# Patient Record
Sex: Male | Born: 1995 | Race: Black or African American | Hispanic: No | Marital: Married | Smoking: Never smoker
Health system: Southern US, Community
[De-identification: ages and names within clinical notes are randomized; demographics above are authoritative.]

## PROBLEM LIST (undated history)

## (undated) HISTORY — PX: HERNIA REPAIR: SHX51

---

## 2013-05-03 ENCOUNTER — Emergency Department (HOSPITAL_COMMUNITY)
Admission: EM | Admit: 2013-05-03 | Discharge: 2013-05-04 | Disposition: A | Discharge status: Home / Self Care | Payer: Medicaid Other | Attending: Emergency Medicine | Admitting: Emergency Medicine

## 2013-05-03 ENCOUNTER — Encounter (HOSPITAL_COMMUNITY): Payer: Self-pay | Admitting: Emergency Medicine

## 2013-05-03 DIAGNOSIS — R000 Tachycardia, unspecified: Secondary | ICD-10-CM | POA: Insufficient documentation

## 2013-05-03 DIAGNOSIS — B349 Viral infection, unspecified: Secondary | ICD-10-CM

## 2013-05-03 DIAGNOSIS — R509 Fever, unspecified: Secondary | ICD-10-CM | POA: Insufficient documentation

## 2013-05-03 DIAGNOSIS — R52 Pain, unspecified: Secondary | ICD-10-CM | POA: Insufficient documentation

## 2013-05-03 DIAGNOSIS — B9789 Other viral agents as the cause of diseases classified elsewhere: Secondary | ICD-10-CM | POA: Insufficient documentation

## 2013-05-03 MED ORDER — IBUPROFEN 800 MG PO TABS
800.0000 mg | ORAL_TABLET | Freq: Once | ORAL | Status: AC
  Administered 2013-05-03: 800 mg via ORAL
  Filled 2013-05-03: qty 1

## 2013-05-03 MED ORDER — SODIUM CHLORIDE 0.9 % IV BOLUS (SEPSIS)
1000.0000 mL | Freq: Once | INTRAVENOUS | Status: AC
  Administered 2013-05-04: 1000 mL via INTRAVENOUS

## 2013-05-03 MED ORDER — ACETAMINOPHEN 500 MG PO TABS: 1000.0000 mg | ORAL_TABLET | Freq: Once | ORAL | Status: DC

## 2013-05-03 NOTE — ED Notes (Signed)
Pt states that he has been having generalized body aches, headache and fever today.

## 2013-05-03 NOTE — ED Provider Notes (Signed)
TIME SEEN: 11:26 PM  CHIEF COMPLAINT: Fever, body aches, headache, sore throat  HPI: Patient is a 17 year old male with no significant past medical history presents the emergency department with fever, diffuse and throbbing headache, body aches, sore throat that started today. Patient reports that he also feels lightheaded.  Denies any nausea, vomiting or diarrhea. No cough, chest pain or shortness of breath. Denies any sick contacts. No recent travel. No rash. No neck pain or neck stiffness. No tick bite.  ROS: See HPI Constitutional:  fever  Eyes: no drainage  ENT: no runny nose   Cardiovascular:  no chest pain  Resp: no SOB  GI: no vomiting GU: no dysuria Integumentary: no rash  Allergy: no hives  Musculoskeletal: no leg swelling  Neurological: no slurred speech ROS otherwise negative  PAST MEDICAL HISTORY/PAST SURGICAL HISTORY:  History reviewed. No pertinent past medical history.  MEDICATIONS:  Prior to Admission medications   Medication Sig Start Date End Date Taking? Authorizing Provider  penicillin v potassium (VEETID) 500 MG tablet Take 500 mg by mouth 4 (four) times daily. For 10 days 04/26/13  Yes Historical Provider, MD    ALLERGIES:  No Known Allergies  SOCIAL HISTORY:  History  Substance Use Topics  . Smoking status: Never Smoker   . Smokeless tobacco: Not on file  . Alcohol Use: No    FAMILY HISTORY: No family history on file.  EXAM: BP 116/67  Pulse 108  Temp(Src) 99.2 F (37.3 C) (Oral)  Resp 18  Wt 141 lb 9.6 oz (64.229 kg)  SpO2 100% CONSTITUTIONAL: Alert and oriented and responds appropriately to questions. Well-appearing; well-nourished; nontoxic HEAD: Normocephalic EYES: Conjunctivae clear, PERRL ENT: normal nose; no rhinorrhea; moist mucous membranes; pharynx is mildly erythematous with mild tonsillar hypertrophy without exudate, no uvular deviation, no voice changes, no trismus, no drooling NECK: Supple, no meningismus, no LAD; negative  Kernig's, negative Brudzinski's CARD: RRR; S1 and S2 appreciated; no murmurs, no clicks, no rubs, no gallops RESP: Normal chest excursion without splinting or tachypnea; breath sounds clear and equal bilaterally; no wheezes, no rhonchi, no rales,  ABD/GI: Normal bowel sounds; non-distended; soft, non-tender, no rebound, no guarding BACK:  The back appears normal and is non-tender to palpation, there is no CVA tenderness EXT: Normal ROM in all joints; non-tender to palpation; no edema; normal capillary refill; no cyanosis    SKIN: Normal color for age and race; warm NEURO: Moves all extremities equally, normal gait, sensation to light touch intact diffusely, cranial nerves II through XII intact PSYCH: The patient's mood and manner are appropriate. Grooming and personal hygiene are appropriate.  MEDICAL DECISION MAKING: Patient with mild fever, tachycardia with relatively benign exam. Suspect viral illness. We'll give antipyretics, IV fluids, obtain basic labs and reassess. Have also send strep test. No concern for any life-threatening illness.  ED PROGRESS: Patient strep test is negative. His labs are unremarkable. Tachycardia has improved with improvement of fever. Patient reports that his headache, body aches are completely resolved after ibuprofen. Given supportive care instructions, return precautions. Patient and family members at bedside verbalize understanding and are comfortable with this plan.     Layla Maw Chetara Kropp, DO 05/04/13 0139

## 2013-05-04 ENCOUNTER — Encounter (HOSPITAL_COMMUNITY): Payer: Self-pay | Admitting: Emergency Medicine

## 2013-05-04 LAB — BASIC METABOLIC PANEL
Chloride: 99 mEq/L (ref 96–112)
GFR calc Af Amer: >90 mL/min (ref >90)
GFR calc non Af Amer: >90 mL/min (ref >90)
Potassium: 3.5 mEq/L (ref 3.5–5.1)
Sodium: 136 mEq/L (ref 135–145)

## 2013-05-04 LAB — CBC WITH DIFFERENTIAL/PLATELET
Basophils Absolute: 0 10*3/uL (ref 0.0–0.1)
Basophils Relative: 0 % (ref 0–1)
Eosinophils Absolute: 0.1 10*3/uL (ref 0.0–0.7)
Eosinophils Relative: 1 % (ref 0–5)
MCH: 27.8 pg (ref 26.0–34.0)
MCHC: 33.4 g/dL (ref 30.0–36.0)
Monocytes Relative: 7 % (ref 3–12)
Neutro Abs: 7.9 10*3/uL — ABNORMAL HIGH (ref 1.7–7.7)
Neutrophils Relative %: 87 % — ABNORMAL HIGH (ref 43–77)
Platelets: 246 10*3/uL (ref 150–400)

## 2013-05-04 LAB — RAPID STREP SCREEN (MED CTR MEBANE ONLY): Streptococcus, Group A Screen (Direct): NEGATIVE

## 2013-05-05 LAB — CULTURE, GROUP A STREP

## 2013-09-06 ENCOUNTER — Encounter (HOSPITAL_COMMUNITY): Payer: Self-pay | Admitting: Emergency Medicine

## 2013-09-06 ENCOUNTER — Emergency Department (INDEPENDENT_AMBULATORY_CARE_PROVIDER_SITE_OTHER)
Admission: EM | Admit: 2013-09-06 | Discharge: 2013-09-06 | Disposition: A | Discharge status: Home / Self Care | Payer: Medicaid Other | Source: Home / Self Care

## 2013-09-06 DIAGNOSIS — R509 Fever, unspecified: Secondary | ICD-10-CM

## 2013-09-06 MED ORDER — OSELTAMIVIR PHOSPHATE 75 MG PO CAPS: 75.0000 mg | ORAL_CAPSULE | Freq: Two times a day (BID) | ORAL | Status: DC

## 2013-09-06 NOTE — ED Notes (Signed)
Pt c/o cold sxs onset 3 days Sxs include: fevers, HA, back pain, sneezing, runny nose Taking OTC cold meds w/no relief Denies v/n Alert w/no signs of acute distress.

## 2013-09-06 NOTE — ED Provider Notes (Signed)
Medical screening examination/treatment/procedure(s) were performed by resident physician or non-physician practitioner and as supervising physician I was immediately available for consultation/collaboration.   Amahd Morino DOUGLAS MD.   Irie Fiorello D Sukari Grist, MD 09/06/13 2008 

## 2013-09-06 NOTE — Discharge Instructions (Signed)
You likely have the flu Please get lots of rest adn start the tamiflu as this will shorten the length of your overall illness Please follow up with your doctor or here if you do not improve or get worse.

## 2013-09-06 NOTE — ED Provider Notes (Signed)
CSN: [id number]     Arrival date & time 09/06/13  1540 History   None    Chief Complaint  Patient presents with  . URI     (Consider location/radiation/quality/duration/timing/severity/associated sxs/prior Treatment) HPI  HA and sneezing for 2 days. Now w/ body aches. Associated w/ fever. Tylenol and nyquil w/o benefit. No sick contacts. Getting worse. Decreased PO. Voiding and stooling nml.    History reviewed. No pertinent past medical history. History reviewed. No pertinent past surgical history. No family history on file. History  Substance Use Topics  . Smoking status: Never Smoker   . Smokeless tobacco: Not on file  . Alcohol Use: No    Review of Systems  Constitutional: Positive for fever, chills, activity change and fatigue.  Respiratory: Negative for wheezing.   Cardiovascular: Negative for chest pain.  Musculoskeletal: Positive for myalgias.      Allergies  Review of patient's allergies indicates no known allergies.  Home Medications   Current Outpatient Rx  Name  Route  Sig  Dispense  Refill  . oseltamivir (TAMIFLU) 75 MG capsule   Oral   Take 1 capsule (75 mg total) by mouth 2 (two) times daily.   10 capsule   0   . penicillin v potassium (VEETID) 500 MG tablet   Oral   Take 500 mg by mouth 4 (four) times daily. For 10 days          BP 119/80  Pulse 97  Temp(Src) 100 F (37.8 C) (Oral)  Resp 18  SpO2 97% Physical Exam  Constitutional: He is oriented to person, place, and time. He appears well-developed and well-nourished. No distress.  HENT:  Head: Normocephalic and atraumatic.  Mouth/Throat: No oropharyngeal exudate.  Eyes: EOM are normal. Pupils are equal, round, and reactive to light.  Neck: Neck supple.  Cardiovascular: Normal rate, regular rhythm, normal heart sounds and intact distal pulses.  Exam reveals no gallop.   No murmur heard. Pulmonary/Chest: Effort normal and breath sounds normal.  Abdominal: Soft. He exhibits no  distension.  Musculoskeletal: Normal range of motion. He exhibits no tenderness.  Neurological: He is oriented to person, place, and time.  Skin: Skin is warm. No rash noted. He is not diaphoretic.  Psychiatric: He has a normal mood and affect. His behavior is normal. Judgment and thought content normal.    ED Course  Procedures (including critical care time) Labs Review Labs Reviewed - No data to display Imaging Review No results found.    MDM   Final diagnoses:  Febrile illness    Likely flu given acute onset HA, Myalgia, fever, anorexia w/o uri symptoms.  - tamiflu - tylenol and ibuprofen prn - precautions given and all questions answered  Shelly Flattenavid Srijan Givan, MD Family Medicine PGY-3 09/06/2013, 6:05 PM      Ozella Rocksavid J Ceylin Dreibelbis, MD 09/06/13 [phone]

## 2014-06-23 ENCOUNTER — Encounter (HOSPITAL_COMMUNITY): Payer: Self-pay | Admitting: Emergency Medicine

## 2014-06-23 ENCOUNTER — Other Ambulatory Visit (HOSPITAL_COMMUNITY)
Admission: RE | Admit: 2014-06-23 | Discharge: 2014-06-23 | Disposition: A | Discharge status: Home / Self Care | Payer: Medicaid Other | Source: Ambulatory Visit | Attending: Emergency Medicine | Admitting: Emergency Medicine

## 2014-06-23 ENCOUNTER — Emergency Department (INDEPENDENT_AMBULATORY_CARE_PROVIDER_SITE_OTHER)
Admission: EM | Admit: 2014-06-23 | Discharge: 2014-06-23 | Disposition: A | Discharge status: Home / Self Care | Payer: Medicaid Other | Source: Home / Self Care | Attending: Emergency Medicine | Admitting: Emergency Medicine

## 2014-06-23 DIAGNOSIS — Z113 Encounter for screening for infections with a predominantly sexual mode of transmission: Secondary | ICD-10-CM | POA: Insufficient documentation

## 2014-06-23 DIAGNOSIS — Z202 Contact with and (suspected) exposure to infections with a predominantly sexual mode of transmission: Secondary | ICD-10-CM

## 2014-06-23 LAB — RPR

## 2014-06-23 LAB — HIV ANTIBODY (ROUTINE TESTING W REFLEX): HIV: NONREACTIVE

## 2014-06-23 MED ORDER — AZITHROMYCIN 250 MG PO TABS
ORAL_TABLET | ORAL | Status: AC
  Filled 2014-06-23: qty 4

## 2014-06-23 MED ORDER — AZITHROMYCIN 250 MG PO TABS
1000.0000 mg | ORAL_TABLET | Freq: Once | ORAL | Status: AC
  Administered 2014-06-23: 1000 mg via ORAL

## 2014-06-23 MED ORDER — CEFTRIAXONE SODIUM 250 MG IJ SOLR
INTRAMUSCULAR | Status: AC
  Filled 2014-06-23: qty 250

## 2014-06-23 MED ORDER — CEFTRIAXONE SODIUM 250 MG IJ SOLR
250.0000 mg | Freq: Once | INTRAMUSCULAR | Status: AC
  Administered 2014-06-23: 250 mg via INTRAMUSCULAR

## 2014-06-23 MED ORDER — LIDOCAINE HCL (PF) 1 % IJ SOLN
INTRAMUSCULAR | Status: AC
  Filled 2014-06-23: qty 5

## 2014-06-23 NOTE — ED Notes (Signed)
Patient understands post injection delay prior to leaving

## 2014-06-23 NOTE — ED Notes (Signed)
No adverse reaction to injection.  

## 2014-06-23 NOTE — Discharge Instructions (Signed)
Sexually Transmitted Disease °A sexually transmitted disease (STD) is a disease or infection that may be passed (transmitted) from person to person, usually during sexual activity. This may happen by way of saliva, semen, blood, vaginal mucus, or urine. Common STDs include:  °· Gonorrhea.   °· Chlamydia.   °· Syphilis.   °· HIV and AIDS.   °· Genital herpes.   °· Hepatitis B and C.   °· Trichomonas.   °· Human papillomavirus (HPV).   °· Pubic lice.   °· Scabies. °· Mites. °· Bacterial vaginosis. °WHAT ARE CAUSES OF STDs? °An STD may be caused by bacteria, a virus, or parasites. STDs are often transmitted during sexual activity if one person is infected. However, they may also be transmitted through nonsexual means. STDs may be transmitted after:  °· Sexual intercourse with an infected person.   °· Sharing sex toys with an infected person.   °· Sharing needles with an infected person or using unclean piercing or tattoo needles. °· Having intimate contact with the genitals, mouth, or rectal areas of an infected person.   °· Exposure to infected fluids during birth. °WHAT ARE THE SIGNS AND SYMPTOMS OF STDs? °Different STDs have different symptoms. Some people may not have any symptoms. If symptoms are present, they may include:  °· Painful or bloody urination.   °· Pain in the pelvis, abdomen, vagina, anus, throat, or eyes.   °· A skin rash, itching, or irritation. °· Growths, ulcerations, blisters, or sores in the genital and anal areas. °· Abnormal vaginal discharge with or without bad odor.   °· Penile discharge in men.   °· Fever.   °· Pain or bleeding during sexual intercourse.   °· Swollen glands in the groin area.   °· Yellow skin and eyes (jaundice). This is seen with hepatitis.   °· Swollen testicles. °· Infertility. °· Sores and blisters in the mouth. °HOW ARE STDs DIAGNOSED? °To make a diagnosis, your health care provider may:  °· Take a medical history.   °· Perform a physical exam.   °· Take a sample of  any discharge to examine. °· Swab the throat, cervix, opening to the penis, rectum, or vagina for testing. °· Test a sample of your first morning urine.   °· Perform blood tests.   °· Perform a Pap test, if this applies.   °· Perform a colposcopy.   °· Perform a laparoscopy.   °HOW ARE STDs TREATED? ° Treatment depends on the STD. Some STDs may be treated but not cured.  °· Chlamydia, gonorrhea, trichomonas, and syphilis can be cured with antibiotic medicine.   °· Genital herpes, hepatitis, and HIV can be treated, but not cured, with prescribed medicines. The medicines lessen symptoms.   °· Genital warts from HPV can be treated with medicine or by freezing, burning (electrocautery), or surgery. Warts may come back.   °· HPV cannot be cured with medicine or surgery. However, abnormal areas may be removed from the cervix, vagina, or vulva.   °· If your diagnosis is confirmed, your recent sexual partners need treatment. This is true even if they are symptom-free or have a negative culture or evaluation. They should not have sex until their health care providers say it is okay. °HOW CAN I REDUCE MY RISK OF GETTING AN STD? °Take these steps to reduce your risk of getting an STD: °· Use latex condoms, dental dams, and water-soluble lubricants during sexual activity. Do not use petroleum jelly or oils. °· Avoid having multiple sex partners. °· Do not have sex with someone who has other sex partners. °· Do not have sex with anyone you do not know or who is at   high risk for an STD. °· Avoid risky sex practices that can break your skin. °· Do not have sex if you have open sores on your mouth or skin. °· Avoid drinking too much alcohol or taking illegal drugs. Alcohol and drugs can affect your judgment and put you in a vulnerable position. °· Avoid engaging in oral and anal sex acts. °· Get vaccinated for HPV and hepatitis. If you have not received these vaccines in the past, talk to your health care provider about whether one  or both might be right for you.   °· If you are at risk of being infected with HIV, it is recommended that you take a prescription medicine daily to prevent HIV infection. This is called pre-exposure prophylaxis (PrEP). You are considered at risk if: °¨ You are a man who has sex with other men (MSM). °¨ You are a heterosexual man or woman and are sexually active with more than one partner. °¨ You take drugs by injection. °¨ You are sexually active with a partner who has HIV. °· Talk with your health care provider about whether you are at high risk of being infected with HIV. If you choose to begin PrEP, you should first be tested for HIV. You should then be tested every 3 months for as long as you are taking PrEP.   °WHAT SHOULD I DO IF I THINK I HAVE AN STD? °· See your health care provider.   °· Tell your sexual partner(s). They should be tested and treated for any STDs. °· Do not have sex until your health care provider says it is okay.  °WHEN SHOULD I GET IMMEDIATE MEDICAL CARE? °Contact your health care provider right away if:  °· You have severe abdominal pain. °· You are a man and notice swelling or pain in your testicles. °· You are a woman and notice swelling or pain in your vagina. °Document Released: 09/26/2002 Document Revised: 07/11/2013 Document Reviewed: 01/24/2013 °ExitCare® Patient Information ©2015 [employer]. This information is not intended to replace advice given to you by your health care provider. Make sure you discuss any questions you have with your health care provider. ° °Safe Sex °Safe sex is about reducing the risk of giving or getting a sexually transmitted disease (STD). STDs are spread through sexual contact involving the genitals, mouth, or rectum. Some STDs can be cured and others cannot. Safe sex can also prevent unintended pregnancies.  °WHAT ARE SOME SAFE SEX PRACTICES? °· Limit your sexual activity to only one partner who is having sex with only you. °· Talk to your partner  about his or her past partners, past STDs, and drug use. °· Use a condom every time you have sexual intercourse. This includes vaginal, oral, and anal sexual activity. Both females and males should wear condoms during oral sex. Only use latex or polyurethane condoms and water-based lubricants. Using petroleum-based lubricants or oils to lubricate a condom will weaken the condom and increase the chance that it will break. The condom should be in place from the beginning to the end of sexual activity. Wearing a condom reduces, but does not completely eliminate, your risk of getting or giving an STD. STDs can be spread by contact with infected body fluids and skin. °· Get vaccinated for hepatitis B and HPV. °· Avoid alcohol and recreational drugs, which can affect your judgment. You may forget to use a condom or participate in high-risk sex. °· For females, avoid douching after sexual intercourse. Douching can spread an infection   farther into the reproductive tract. °· Check your body for signs of sores, blisters, rashes, or unusual discharge. See your health care provider if you notice any of these signs. °· Avoid sexual contact if you have symptoms of an infection or are being treated for an STD. If you or your partner has herpes, avoid sexual contact when blisters are present. Use condoms at all other times. °· If you are at risk of being infected with HIV, it is recommended that you take a prescription medicine daily to prevent HIV infection. This is called pre-exposure prophylaxis (PrEP). You are considered at risk if: °¨ You are a man who has sex with other men (MSM). °¨ You are a heterosexual man or woman who is sexually active with more than one partner. °¨ You take drugs by injection. °¨ You are sexually active with a partner who has HIV. °· Talk with your health care provider about whether you are at high risk of being infected with HIV. If you choose to begin PrEP, you should first be tested for HIV. You  should then be tested every 3 months for as long as you are taking PrEP. °· See your health care provider for regular screenings, exams, and tests for other STDs. Before having sex with a new partner, each of you should be screened for STDs and should talk about the results with each other. °WHAT ARE THE BENEFITS OF SAFE SEX?  °· There is less chance of getting or giving an STD. °· You can prevent unwanted or unintended pregnancies. °· By discussing safe sex concerns with your partner, you may increase feelings of intimacy, comfort, trust, and honesty between the two of you. °Document Released: 08/13/2004 Document Revised: 11/20/2013 Document Reviewed: 12/28/2011 °ExitCare® Patient Information ©2015 [employer]. This information is not intended to replace advice given to you by your health care provider. Make sure you discuss any questions you have with your health care provider. ° °

## 2014-06-23 NOTE — ED Provider Notes (Signed)
CSN: [id number]     Arrival date & time 06/23/14  1256 History   First MD Initiated Contact with Patient 06/23/14 1312     Chief Complaint  Patient presents with  . SEXUALLY TRANSMITTED DISEASE   (Consider location/radiation/quality/duration/timing/severity/associated sxs/prior Treatment) HPI Comments: Patient was informed by his partner yesterday that they were diagnosed with chlamydia. Patient reports himself to be asymptomatic but is here to request testing and treatment. Reports himself to be otherwise healthy Is a student  The history is provided by the patient.    History reviewed. No pertinent past medical history. History reviewed. No pertinent past surgical history. No family history on file. History  Substance Use Topics  . Smoking status: Never Smoker   . Smokeless tobacco: Not on file  . Alcohol Use: No    Review of Systems  All other systems reviewed and are negative.   Allergies  Review of patient's allergies indicates no known allergies.  Home Medications   Prior to Admission medications   Medication Sig Start Date End Date Taking? Authorizing Provider  oseltamivir (TAMIFLU) 75 MG capsule Take 1 capsule (75 mg total) by mouth 2 (two) times daily. 09/06/13   Ozella Rocksavid J Merrell, MD  penicillin v potassium (VEETID) 500 MG tablet Take 500 mg by mouth 4 (four) times daily. For 10 days 04/26/13   Historical Provider, MD   BP 105/65 mmHg  Pulse 98  Temp(Src) 97.9 F (36.6 C)  Resp 12  SpO2 100% Physical Exam  Constitutional: He is oriented to person, place, and time. He appears well-developed.  HENT:  Head: Normocephalic and atraumatic.  Eyes: Conjunctivae are normal. No scleral icterus.  Cardiovascular: Normal rate.   Pulmonary/Chest: Effort normal.  Genitourinary:  Patient declined exam of genitalia  Musculoskeletal: Normal range of motion.  Neurological: He is alert and oriented to person, place, and time.  Skin: Skin is warm and dry.  Psychiatric: He  has a normal mood and affect. His behavior is normal.  Nursing note and vitals reviewed.   ED Course  Procedures (including critical care time) Labs Review Labs Reviewed  RPR  HIV ANTIBODY (ROUTINE TESTING)  URINE CYTOLOGY ANCILLARY ONLY    Imaging Review No results found.   MDM   1. Exposure to STD    Specimens sent for testing (gonorrhea, chlamydia, trichomonas, HIV, syphilis). [facility] ceftriaxone 250mg  IM and azithromycin 1000mg  po. Advised to follow up with [facility] if additional concerns.   Ria ClockJennifer Lee H Makenly Larabee, [place] 06/23/14 1340

## 2014-06-23 NOTE — ED Notes (Signed)
sti check per patient.  Denies symptoms.  Reports sexual partner reported having chlamydia.

## 2014-06-25 LAB — URINE CYTOLOGY ANCILLARY ONLY
Chlamydia: NEGATIVE
Neisseria Gonorrhea: NEGATIVE
Trichomonas: NEGATIVE

## 2016-07-31 ENCOUNTER — Ambulatory Visit (HOSPITAL_COMMUNITY)
Admission: EM | Admit: 2016-07-31 | Discharge: 2016-07-31 | Disposition: A | Discharge status: Home / Self Care | Payer: BLUE CROSS/BLUE SHIELD | Attending: Family Medicine | Admitting: Family Medicine

## 2016-07-31 ENCOUNTER — Encounter (HOSPITAL_COMMUNITY): Payer: Self-pay | Admitting: Emergency Medicine

## 2016-07-31 DIAGNOSIS — J111 Influenza due to unidentified influenza virus with other respiratory manifestations: Secondary | ICD-10-CM

## 2016-07-31 MED ORDER — PHENYLEPHRINE-CHLORPHEN-DM 10-4-12.5 MG/5ML PO LIQD: 5.0000 mL | ORAL | 0 refills | Status: DC | PRN

## 2016-07-31 MED ORDER — TRAMADOL HCL 50 MG PO TABS: 50.0000 mg | ORAL_TABLET | Freq: Four times a day (QID) | ORAL | 0 refills | Status: DC | PRN

## 2016-07-31 MED ORDER — ACETAMINOPHEN 325 MG PO TABS
ORAL_TABLET | ORAL | Status: AC
  Filled 2016-07-31: qty 2

## 2016-07-31 MED ORDER — OSELTAMIVIR PHOSPHATE 75 MG PO CAPS: 75.0000 mg | ORAL_CAPSULE | Freq: Two times a day (BID) | ORAL | 0 refills | Status: DC

## 2016-07-31 MED ORDER — ACETAMINOPHEN 325 MG PO TABS
650.0000 mg | ORAL_TABLET | Freq: Once | ORAL | Status: AC
  Administered 2016-07-31: 650 mg via ORAL

## 2016-07-31 NOTE — ED Triage Notes (Signed)
Here for cold sx onset yest associated w/HA, back pain, fevers, vomiting, runny nose, tachycardia  Taking OTC cold meds w/no relief.   A&O x4... NAD

## 2016-07-31 NOTE — Discharge Instructions (Signed)
.   Important for you to force fluids and supplement with Pedialyte. This will help  you to feel better. Take the medications as directed. Ibuprofen 600 mg every 6 hours as needed. Rest. If worse seek medical attention promptly.

## 2016-07-31 NOTE — ED Provider Notes (Signed)
CSN: [id number]     Arrival date & time 07/31/16  1447 History   First MD Initiated Contact with Patient 07/31/16 1655     Chief Complaint  Patient presents with  . URI   (Consider location/radiation/quality/duration/timing/severity/associated sxs/prior Treatment) 21 year old male planing of generalized body aches, fatigue and malaise, headache, fever, increased heart rate, runny nose, cough, vomiting once with nausea. He has not had the flu shot. Only medication Tylenol. Current temperature 102.9.      History reviewed. No pertinent past medical history. History reviewed. No pertinent surgical history. History reviewed. No pertinent family history. Social History  Substance Use Topics  . Smoking status: Never Smoker  . Smokeless tobacco: Never Used  . Alcohol use No    Review of Systems  Constitutional: Positive for activity change, chills, fatigue and fever. Negative for diaphoresis.  HENT: Positive for congestion, postnasal drip and sore throat. Negative for ear pain, facial swelling, rhinorrhea and trouble swallowing.   Eyes: Negative for pain, discharge and redness.  Respiratory: Positive for cough. Negative for chest tightness and shortness of breath.   Cardiovascular: Negative.   Gastrointestinal: Negative.   Musculoskeletal: Positive for myalgias. Negative for neck pain and neck stiffness.  Neurological: Negative.     Allergies  Pineapple  Home Medications   Prior to Admission medications   Medication Sig Start Date End Date Taking? Authorizing Provider  oseltamivir (TAMIFLU) 75 MG capsule Take 1 capsule (75 mg total) by mouth 2 (two) times daily. X 5 days 07/31/16   Hayden Rasmussen, NP  Phenylephrine-Chlorphen-DM 04-23-11.5 MG/5ML LIQD Take 5 mLs by mouth every 4 (four) hours as needed. For congestion and drainage. 07/31/16   Hayden Rasmussen, NP  traMADol (ULTRAM) 50 MG tablet Take 1 tablet (50 mg total) by mouth every 6 (six) hours as needed. For pain 07/31/16   Hayden Rasmussen, NP    Meds Ordered and Administered this Visit   Medications  acetaminophen (TYLENOL) tablet 650 mg (650 mg Oral Given 07/31/16 1625)    BP (!) 99/46 (BP Location: Left Arm)   Pulse (!) 122   Temp 102.9 F (39.4 C) (Oral)   Resp 16   SpO2 100%  No data found.   Physical Exam  Constitutional: He is oriented to person, place, and time. He appears well-developed and well-nourished. No distress.  HENT:  Head: Normocephalic and atraumatic.  Mouth/Throat: No oropharyngeal exudate.  Bilateral TMs mildly retracted otherwise normal. Oropharynx with minor erythema no swelling or exudate.  Eyes: EOM are normal.  Neck: Normal range of motion. Neck supple.  Cardiovascular: Regular rhythm and normal heart sounds.   Tachycardia  Pulmonary/Chest: Effort normal and breath sounds normal. No respiratory distress. He has no wheezes.  Abdominal: Soft. There is no tenderness.  Musculoskeletal: Normal range of motion. He exhibits no edema.  Lymphadenopathy:    He has no cervical adenopathy.  Neurological: He is alert and oriented to person, place, and time.  Skin: Skin is warm and dry. No rash noted.  Psychiatric: He has a normal mood and affect.  Nursing note and vitals reviewed.   Urgent Care Course   Clinical Course     Procedures (including critical care time)  Labs Review Labs Reviewed - No data to display  Imaging Review No results found.   Visual Acuity Review  Right Eye Distance:   Left Eye Distance:   Bilateral Distance:    Right Eye Near:   Left Eye Near:    Bilateral Near:  MDM   1. Influenza     Important for you to force fluids and supplement with Pedialyte. This will help  you to feel better. Take the medications as directed. Ibuprofen 600 mg every 6 hours as needed. Rest. If worse seek medical attention promptly. Meds ordered this encounter  Medications  . acetaminophen (TYLENOL) tablet 650 mg  . oseltamivir (TAMIFLU) 75 MG capsule    Sig: Take 1  capsule (75 mg total) by mouth 2 (two) times daily. X 5 days    Dispense:  10 capsule    Refill:  0    Order Specific Question:   Supervising Provider    Answer:   Linna HoffKINDL, JAMES D [phone][[id number]]  . traMADol (ULTRAM) 50 MG tablet    Sig: Take 1 tablet (50 mg total) by mouth every 6 (six) hours as needed. For pain    Dispense:  15 tablet    Refill:  0    Order Specific Question:   Supervising Provider    Answer:   Linna HoffKINDL, JAMES D [phone][[id number]]  . Phenylephrine-Chlorphen-DM 04-23-11.5 MG/5ML LIQD    Sig: Take 5 mLs by mouth every 4 (four) hours as needed. For congestion and drainage.    Dispense:  120 mL    Refill:  0    Order Specific Question:   Supervising Provider    Answer:   Linna HoffKINDL, JAMES D [[id number]]       Hayden Rasmussenavid Shanele Nissan, NP 07/31/16 1737

## 2016-09-24 ENCOUNTER — Encounter (HOSPITAL_COMMUNITY): Payer: Self-pay | Admitting: *Deleted

## 2016-09-24 ENCOUNTER — Ambulatory Visit (HOSPITAL_COMMUNITY)
Admission: EM | Admit: 2016-09-24 | Discharge: 2016-09-24 | Disposition: A | Discharge status: Home / Self Care | Payer: BLUE CROSS/BLUE SHIELD | Attending: Internal Medicine | Admitting: Internal Medicine

## 2016-09-24 DIAGNOSIS — K625 Hemorrhage of anus and rectum: Secondary | ICD-10-CM | POA: Diagnosis not present

## 2016-09-24 DIAGNOSIS — K5900 Constipation, unspecified: Secondary | ICD-10-CM

## 2016-09-24 MED ORDER — HYDROCORTISONE ACETATE 25 MG RE SUPP: 25.0000 mg | Freq: Two times a day (BID) | RECTAL | 0 refills | Status: AC

## 2016-09-24 NOTE — Discharge Instructions (Signed)
For your constipation. I recommend increasing dietary fiber by eating more green leafy vegetables, fresh fruit, whole grains, or supplementation with over-the-counter psilium containing products such as Metamucil. Should you continue to have rectal bleeding, follow-up with your primary care provider in 1-2 weeks as needed for further evaluation and management. To help with your symptoms, I have prescribed a suppository called Anusol. Use one suppository up to 2 times a day as needed for your symptoms.

## 2016-09-24 NOTE — ED Triage Notes (Signed)
Pt  Reports  Rectal pain/ bleeding  For  Several  Weeks      Worse  After  A  bm       Pt  States  Pain is  Worse  At  Night  Denies  Any  prevouus  Symptoms

## 2016-09-24 NOTE — ED Provider Notes (Signed)
CSN: [id number]     Arrival date & time 09/24/16  1834 History   First MD Initiated Contact with Patient 09/24/16 1855     Chief Complaint  Patient presents with  . Rectal Bleeding   (Consider location/radiation/quality/duration/timing/severity/associated sxs/prior Treatment) 21 year old male presents to clinic with a chief complaint of rectal pain, and bleeding for several weeks. States he's had bright red blood on the tissue paper following bowel movements, and he has had to strain with bowel movements. He has engaged in receptive anal intercourse, denies use of any adult marital aids, states he has recently tested negative for any sexually transmitted diseases, and he states he has been refraining from intercourse with his partner due to pain. He does state he feels as if his symptoms have been improving somewhat, as has been less blood present over the last few days. He denies any history of hemorrhoids, or other rectal or GI problems.  With regard to his constipation. States his diet his relatively low in fiber, today he is mostly eaten potato chips, and other starchy foods. Last regular bowel movement was yesterday.  On review of systems, he has had no weakness, dizziness, lightheadedness, shortness of breath, chest pain, wheezes, nausea, vomiting, diarrhea, abdominal pain, he has had constipation, has had no swollen lymph nodes, no fever, no chills, no skin lesions or rashes, no swelling in his hands, feet, or ankles. Social history is negative for smoking, alcohol, he denies recreational drug use. Family history is noncontributory.   The history is provided by the patient.  Rectal Bleeding    History reviewed. No pertinent past medical history. History reviewed. No pertinent surgical history. History reviewed. No pertinent family history. Social History  Substance Use Topics  . Smoking status: Never Smoker  . Smokeless tobacco: Never Used  . Alcohol use No    Review of Systems   Reason unable to perform ROS: as covered in HPI.  Gastrointestinal: Positive for hematochezia.  All other systems reviewed and are negative.   Allergies  Pineapple  Home Medications   Prior to Admission medications   Medication Sig Start Date End Date Taking? Authorizing Provider  hydrocortisone (ANUSOL-HC) 25 MG suppository Place 1 suppository (25 mg total) rectally 2 (two) times daily. 09/24/16   Dorena BodoLawrence Shaunee Mulkern, NP   Meds Ordered and Administered this Visit  Medications - No data to display  BP 122/78 (BP Location: Right Arm)   Pulse 78   Temp 98.6 F (37 C) (Oral)   Resp 18   SpO2 100%  No data found.   Physical Exam  Constitutional: He is oriented to person, place, and time. He appears well-developed and well-nourished. No distress.  HENT:  Head: Normocephalic and atraumatic.  Right Ear: External ear normal.  Left Ear: External ear normal.  Cardiovascular: Normal rate and regular rhythm.   Pulmonary/Chest: Effort normal and breath sounds normal.  Abdominal: Soft. Bowel sounds are normal. He exhibits no distension. There is no tenderness.  Genitourinary: Prostate normal and penis normal. Rectal exam shows tenderness. Rectal exam shows no external hemorrhoid, no internal hemorrhoid, no fissure, no mass and anal tone normal. Circumcised.  Musculoskeletal: He exhibits no edema or tenderness.  Neurological: He is alert and oriented to person, place, and time.  Skin: Skin is warm and dry. Capillary refill takes less than 2 seconds. He is not diaphoretic.  Psychiatric: He has a normal mood and affect. His behavior is normal.  Nursing note and vitals reviewed.   Urgent Care Course  Procedures (including critical care time)  Labs Review Labs Reviewed - No data to display  Imaging Review No results found.     MDM   1. Rectal bleeding   2. Constipation, unspecified constipation type    For your constipation. I recommend increasing dietary fiber by eating  more green leafy vegetables, fresh fruit, whole grains, or supplementation with over-the-counter psilium containing products such as Metamucil. Should you continue to have rectal bleeding, follow-up with your primary care provider in 1-2 weeks as needed for further evaluation and management. To help with your symptoms, I have prescribed a suppository called Anusol. Use one suppository up to 2 times a day as needed for your symptoms.     Dorena Bodo, NP 09/24/16 [phone]

## 2018-03-04 ENCOUNTER — Ambulatory Visit (HOSPITAL_COMMUNITY)
Admission: EM | Admit: 2018-03-04 | Discharge: 2018-03-04 | Disposition: A | Discharge status: Home / Self Care | Payer: Self-pay | Attending: Family Medicine | Admitting: Family Medicine

## 2018-03-04 ENCOUNTER — Encounter (HOSPITAL_COMMUNITY): Payer: Self-pay

## 2018-03-04 DIAGNOSIS — K529 Noninfective gastroenteritis and colitis, unspecified: Secondary | ICD-10-CM

## 2018-03-04 MED ORDER — DICYCLOMINE HCL 20 MG PO TABS: 20.0000 mg | ORAL_TABLET | Freq: Two times a day (BID) | ORAL | 0 refills | Status: DC

## 2018-03-04 NOTE — ED Triage Notes (Signed)
Pt presents with complaints of headache, diarrhea and bodyaches x 4 days.

## 2018-03-04 NOTE — Discharge Instructions (Addendum)
It was nice meeting you!!  I believe that you have a stomach virus.  We we will give you some medication to help with the symptoms. Just make sure you are resting and staying hydrated. Follow-up if no improvement in symptoms or worsening symptoms.

## 2018-03-04 NOTE — ED Provider Notes (Signed)
[facility]    CSN: [id number] Arrival date & time: 03/04/18  1152     History   Chief Complaint Chief Complaint  Patient presents with  . Diarrhea  . Headache    HPI Aaron Barton is a 22 y.o. male.   Pt is a healthy 22 year old male that presents with diarrhea, abd cramping and headache x 4 days. He reports 4 to 5 episodes of watery diarrhea a day. sts some nausea but without vomiting. Denies any blood in the stool. sts fever and chills last night. He took some tylenol and the fever broke. He has been trying to eat bland meals and drinking fluids. He was around his grandmother this past weekend and she has had similar symptoms. He denies any recent traveling or antibiotic use. The headache has been intermittent and generalized. Describes it as tension. Denies any dizziness, vision changes, photophobia, phonophobia.  No hx of migraines. He is not currently having a headache. No medical hx He does not smoke.   ROS per HPI      History reviewed. No pertinent past medical history.  There are no active problems to display for this patient.   History reviewed. No pertinent surgical history.     Home Medications    Prior to Admission medications   Medication Sig Start Date End Date Taking? Authorizing Provider  dicyclomine (BENTYL) 20 MG tablet Take 1 tablet (20 mg total) by mouth 2 (two) times daily. 03/04/18   Dahlia ByesBast, Aleeza [place] A, NP  hydrocortisone (ANUSOL-HC) 25 MG suppository Place 1 suppository (25 mg total) rectally 2 (two) times daily. 09/24/16   Dorena BodoKennard, Lawrence, NP    Family History Family History  Problem Relation Age of Onset  . Healthy Mother   . Healthy Father     Social History Social History   Tobacco Use  . Smoking status: Never Smoker  . Smokeless tobacco: Never Used  Substance Use Topics  . Alcohol use: No  . Drug use: No     Allergies   Pineapple and Vancomycin   Review of Systems Review of Systems   Physical Exam Triage  Vital Signs ED Triage Vitals [03/04/18 1213]  Enc Vitals Group     BP 120/64     Pulse Rate 89     Resp 18     Temp 98.4 F (36.9 C)     Temp src      SpO2      Weight      Height      Head Circumference      Peak Flow      Pain Score 7     Pain Loc      Pain Edu?      Excl. in GC?    No data found.  Updated Vital Signs BP 120/64   Pulse 89   Temp 98.4 F (36.9 C)   Resp 18   Visual Acuity Right Eye Distance:   Left Eye Distance:   Bilateral Distance:    Right Eye Near:   Left Eye Near:    Bilateral Near:     Physical Exam  Constitutional: He is oriented to person, place, and time. He appears well-developed and well-nourished.  Non-toxic appearance. He does not appear ill.  HENT:  Head: Normocephalic and atraumatic.  Neck: Normal range of motion.  Pulmonary/Chest: Effort normal.  Abdominal: Soft. Bowel sounds are normal. He exhibits no distension and no mass. There is no tenderness. There is no guarding.  Nontender in all 4 quadrants.  No rebound tenderness.   Neurological: He is alert and oriented to person, place, and time.  Skin: Skin is warm and dry.  Nursing note and vitals reviewed.    UC Treatments / Results  Labs (all labs ordered are listed, but only abnormal results are displayed) Labs Reviewed - No data to display  EKG None  Radiology No results found.  Procedures Procedures (including critical care time)  Medications Ordered in UC Medications - No data to display  Initial Impression / Assessment and Plan / UC Course  I have reviewed the triage vital signs and the nursing notes.  Pertinent labs & imaging results that were available during my care of the patient were reviewed by me and considered in my medical decision making (see chart for details).     Most likely viral gastroenteritis. Will try bentyl to treat the abd cramping and diarrhea. Stay hydrated and advance diet as tolerated. Work note given.  Follow up as needed.    Final Clinical Impressions(s) / UC Diagnoses   Final diagnoses:  Gastroenteritis     Discharge Instructions     It was nice meeting you!!  I believe that you have a stomach virus.  We we will give you some medication to help with the symptoms. Just make sure you are resting and staying hydrated. Follow-up if no improvement in symptoms or worsening symptoms.    ED Prescriptions    Medication Sig Dispense Auth. Provider   dicyclomine (BENTYL) 20 MG tablet Take 1 tablet (20 mg total) by mouth 2 (two) times daily. 20 tablet Janace ArisBast, Alwin Lanigan A, NP     Controlled Substance Prescriptions [place] Controlled Substance Registry consulted? no'   Eldo Umanzor A, NP 03/04/18 1413

## 2018-04-26 ENCOUNTER — Ambulatory Visit (HOSPITAL_COMMUNITY)
Admission: EM | Admit: 2018-04-26 | Discharge: 2018-04-26 | Disposition: A | Discharge status: Home / Self Care | Payer: Self-pay | Attending: Family Medicine | Admitting: Family Medicine

## 2018-04-26 ENCOUNTER — Encounter (HOSPITAL_COMMUNITY): Payer: Self-pay

## 2018-04-26 ENCOUNTER — Other Ambulatory Visit: Payer: Self-pay

## 2018-04-26 DIAGNOSIS — J020 Streptococcal pharyngitis: Secondary | ICD-10-CM

## 2018-04-26 LAB — POCT RAPID STREP A: Streptococcus, Group A Screen (Direct): POSITIVE — AB

## 2018-04-26 MED ORDER — AMOXICILLIN 500 MG PO CAPS: 500.0000 mg | ORAL_CAPSULE | Freq: Two times a day (BID) | ORAL | 0 refills | Status: AC

## 2018-04-26 NOTE — ED Triage Notes (Signed)
Pt states he has a sore throat and body aches  1 week or more.

## 2018-04-26 NOTE — ED Provider Notes (Signed)
[facility]   [id number] 04/26/18 Arrival Time: 4098  JX:BJYN THROAT  SUBJECTIVE: History from: patient.  Aaron Barton is a 22 y.o. male who presents with abrupt onset of worsening sore throat x 2 week.  Denies to sick exposure or precipitating event.  Has tried OTC medications without relief.  Symptoms are made worse with swallowing.  Reports previous symptoms in the past. Complains of associated fatigue, fever, nasal congestion and body aches.    Denies sinus pain, cough, SOB, wheezing, chest pain, rash, changes in bowel or bladder habits.    ROS: As per HPI.  History reviewed. No pertinent past medical history. History reviewed. No pertinent surgical history. Allergies  Allergen Reactions  . Pineapple Itching  . Vancomycin Hives   No current facility-administered medications on file prior to encounter.    Current Outpatient Medications on File Prior to Encounter  Medication Sig Dispense Refill  . dicyclomine (BENTYL) 20 MG tablet Take 1 tablet (20 mg total) by mouth 2 (two) times daily. 20 tablet 0  . hydrocortisone (ANUSOL-HC) 25 MG suppository Place 1 suppository (25 mg total) rectally 2 (two) times daily. 12 suppository 0   Social History   Socioeconomic History  . Marital status: Single    Spouse name: Not on file  . Number of children: Not on file  . Years of education: Not on file  . Highest education level: Not on file  Occupational History  . Not on file  Social Needs  . Financial resource strain: Not on file  . Food insecurity:    Worry: Not on file    Inability: Not on file  . Transportation needs:    Medical: Not on file    Non-medical: Not on file  Tobacco Use  . Smoking status: Never Smoker  . Smokeless tobacco: Never Used  Substance and Sexual Activity  . Alcohol use: No  . Drug use: No  . Sexual activity: Not on file  Lifestyle  . Physical activity:    Days per week: Not on file    Minutes per session: Not on file  . Stress: Not on  file  Relationships  . Social connections:    Talks on phone: Not on file    Gets together: Not on file    Attends religious service: Not on file    Active member of club or organization: Not on file    Attends meetings of clubs or organizations: Not on file    Relationship status: Not on file  . Intimate partner violence:    Fear of current or ex partner: Not on file    Emotionally abused: Not on file    Physically abused: Not on file    Forced sexual activity: Not on file  Other Topics Concern  . Not on file  Social History Narrative  . Not on file   Family History  Problem Relation Age of Onset  . Healthy Mother   . Healthy Father     OBJECTIVE:  Vitals:   04/26/18 0929 04/26/18 0931  BP: 113/68   Pulse: (!) 114   Resp: 18   Temp: 100 F (37.8 C)   TempSrc: Oral   SpO2: 100%   Weight:  143 lb (64.9 kg)     General appearance: alert; appears fatigued; nontoxic, tolerating own secretions without difficulty HEENT: NCAT; Ears: EACs clear, TMs pearly gray with visible cone of light, without erythema; Eyes: PERRL, EOMI grossly; Nose: clear rhinorrhea; Throat: oropharynx mildly erythematous, tonsils erythematous  without white tonsillar exudates, uvula midline Neck: supple without LAD Lungs: CTA bilaterally without adventitious breath sounds Heart: regular rate and rhythm.  Radial pulses 2+ symmetrical bilaterally Skin: warm and dry Psychological: alert and cooperative; normal mood and affect  Labs: Results for orders placed or performed during the hospital encounter of 04/26/18 (from the past 24 hour(s))  POCT rapid strep A [facility])     Status: Abnormal   Collection Time: 04/26/18  9:45 AM  Result Value Ref Range   Streptococcus, Group A Screen (Direct) POSITIVE (A) NEGATIVE     ASSESSMENT & PLAN:  1. Strep pharyngitis     Meds ordered this encounter  Medications  . amoxicillin (AMOXIL) 500 MG capsule    Sig: Take 1 capsule (500 mg total) by mouth 2  (two) times daily for 10 days.    Dispense:  20 capsule    Refill:  0    Order Specific Question:   Supervising Provider    Answer:   Isa Rankin [[id number]]    Strep was positive.  Drink plenty of water and rest Prescribed amoxicillin 500mg  twice daily for 10 days.  Take as directed and to completion.  Take ibuprofen and/or tylenol as needed for pain and fever.  Follow up with PCP or [facility] if symptoms persist Return or go to ER if you have any new or worsening symptoms    Reviewed expectations re: course of current medical issues. Questions answered. Outlined signs and symptoms indicating need for more acute intervention. Patient verbalized understanding. After Visit Summary given.        Rennis Harding, PA-C 04/26/18 1004

## 2018-04-26 NOTE — Discharge Instructions (Addendum)
Strep was positive.  Drink plenty of water and rest Prescribed amoxicillin 500mg  twice daily for 10 days.  Take as directed and to completion.  Take ibuprofen and/or tylenol as needed for pain and fever.  Follow up with PCP or [facility] if symptoms persist Return or go to ER if you have any new or worsening symptoms

## 2018-10-23 ENCOUNTER — Emergency Department (HOSPITAL_COMMUNITY)
Admission: EM | Admit: 2018-10-23 | Discharge: 2018-10-23 | Disposition: A | Discharge status: Home / Self Care | Payer: Self-pay | Attending: Emergency Medicine | Admitting: Emergency Medicine

## 2018-10-23 ENCOUNTER — Encounter (HOSPITAL_COMMUNITY): Payer: Self-pay | Admitting: Emergency Medicine

## 2018-10-23 ENCOUNTER — Other Ambulatory Visit: Payer: Self-pay

## 2018-10-23 ENCOUNTER — Emergency Department (HOSPITAL_COMMUNITY): Payer: Self-pay

## 2018-10-23 DIAGNOSIS — R198 Other specified symptoms and signs involving the digestive system and abdomen: Secondary | ICD-10-CM

## 2018-10-23 DIAGNOSIS — R197 Diarrhea, unspecified: Secondary | ICD-10-CM | POA: Insufficient documentation

## 2018-10-23 DIAGNOSIS — Z79899 Other long term (current) drug therapy: Secondary | ICD-10-CM | POA: Insufficient documentation

## 2018-10-23 DIAGNOSIS — R1032 Left lower quadrant pain: Secondary | ICD-10-CM | POA: Insufficient documentation

## 2018-10-23 DIAGNOSIS — R112 Nausea with vomiting, unspecified: Secondary | ICD-10-CM | POA: Insufficient documentation

## 2018-10-23 DIAGNOSIS — R301 Vesical tenesmus: Secondary | ICD-10-CM | POA: Insufficient documentation

## 2018-10-23 LAB — COMPREHENSIVE METABOLIC PANEL
ALT: 20 U/L (ref 0–44)
AST: 26 U/L (ref 15–41)
Albumin: 4.5 g/dL (ref 3.5–5.0)
Alkaline Phosphatase: 74 U/L (ref 38–126)
Anion gap: 10 (ref 5–15)
BUN: 17 mg/dL (ref 6–20)
CO2: 25 mmol/L (ref 22–32)
Calcium: 9.6 mg/dL (ref 8.9–10.3)
Chloride: 101 mmol/L (ref 98–111)
Creatinine, Ser: 1.29 mg/dL — ABNORMAL HIGH (ref 0.61–1.24)
GFR calc Af Amer: >60 mL/min (ref >60)
GFR calc non Af Amer: >60 mL/min (ref >60)
Glucose, Bld: 121 mg/dL — ABNORMAL HIGH (ref 70–99)
Potassium: 3.7 mmol/L (ref 3.5–5.1)
Sodium: 136 mmol/L (ref 135–145)
Total Bilirubin: 1 mg/dL (ref 0.3–1.2)
Total Protein: 7.9 g/dL (ref 6.5–8.1)

## 2018-10-23 LAB — CBC WITH DIFFERENTIAL/PLATELET
Abs Immature Granulocytes: 0.03 10*3/uL (ref 0.00–0.07)
Basophils Absolute: 0 10*3/uL (ref 0.0–0.1)
Basophils Relative: 0 %
Eosinophils Absolute: 0 10*3/uL (ref 0.0–0.5)
Eosinophils Relative: 0 %
HCT: 46.4 % (ref 39.0–52.0)
Hemoglobin: 15.5 g/dL (ref 13.0–17.0)
Immature Granulocytes: 0 %
Lymphocytes Relative: 5 %
Lymphs Abs: 0.4 10*3/uL — ABNORMAL LOW (ref 0.7–4.0)
MCH: 32.1 pg (ref 26.0–34.0)
MCHC: 33.4 g/dL (ref 30.0–36.0)
MCV: 96.1 fL (ref 80.0–100.0)
Monocytes Absolute: 0.2 10*3/uL (ref 0.1–1.0)
Monocytes Relative: 3 %
Neutro Abs: 6.3 10*3/uL (ref 1.7–7.7)
Neutrophils Relative %: 92 %
Platelets: 217 10*3/uL (ref 150–400)
RBC: 4.83 MIL/uL (ref 4.22–5.81)
RDW: 12.3 % (ref 11.5–15.5)
WBC Morphology: INCREASED
WBC: 6.9 10*3/uL (ref 4.0–10.5)
nRBC: 0 % (ref 0.0–0.2)

## 2018-10-23 LAB — URINALYSIS, ROUTINE W REFLEX MICROSCOPIC
Bacteria, UA: NONE SEEN
Bilirubin Urine: NEGATIVE
Glucose, UA: NEGATIVE mg/dL
Hgb urine dipstick: NEGATIVE
Ketones, ur: NEGATIVE mg/dL
Leukocytes,Ua: NEGATIVE
Nitrite: NEGATIVE
Protein, ur: 30 mg/dL — AB
Specific Gravity, Urine: 1.027 (ref 1.005–1.030)
pH: 5 (ref 5.0–8.0)

## 2018-10-23 LAB — RAPID HIV SCREEN (HIV 1/2 AB+AG)
HIV 1/2 Antibodies: NONREACTIVE
HIV-1 P24 Antigen - HIV24: NONREACTIVE

## 2018-10-23 LAB — LACTIC ACID, PLASMA
Lactic Acid, Venous: 1.6 mmol/L (ref 0.5–1.9)
Lactic Acid, Venous: 1 mmol/L (ref 0.5–1.9)

## 2018-10-23 LAB — LIPASE, BLOOD: Lipase: 24 U/L (ref 11–51)

## 2018-10-23 MED ORDER — DOXYCYCLINE HYCLATE 100 MG PO TABS
100.0000 mg | ORAL_TABLET | Freq: Once | ORAL | Status: AC
  Administered 2018-10-23: 100 mg via ORAL
  Filled 2018-10-23: qty 1

## 2018-10-23 MED ORDER — ACETAMINOPHEN 325 MG PO TABS
650.0000 mg | ORAL_TABLET | Freq: Once | ORAL | Status: AC
  Administered 2018-10-23: 650 mg via ORAL
  Filled 2018-10-23: qty 2

## 2018-10-23 MED ORDER — DOXYCYCLINE HYCLATE 100 MG PO CAPS: 100.0000 mg | ORAL_CAPSULE | Freq: Two times a day (BID) | ORAL | 0 refills | Status: DC

## 2018-10-23 MED ORDER — KETOROLAC TROMETHAMINE 30 MG/ML IJ SOLN: 30.0000 mg | Freq: Once | INTRAMUSCULAR | Status: DC

## 2018-10-23 MED ORDER — SODIUM CHLORIDE 0.9 % IV BOLUS
500.0000 mL | Freq: Once | INTRAVENOUS | Status: AC
Start: 2018-10-23 — End: 2018-10-23
  Administered 2018-10-23: 500 mL via INTRAVENOUS

## 2018-10-23 MED ORDER — IOHEXOL 300 MG/ML  SOLN
100.0000 mL | Freq: Once | INTRAMUSCULAR | Status: AC | PRN
  Administered 2018-10-23: 100 mL via INTRAVENOUS

## 2018-10-23 MED ORDER — STERILE WATER FOR INJECTION IJ SOLN
INTRAMUSCULAR | Status: AC
  Filled 2018-10-23: qty 10

## 2018-10-23 MED ORDER — ONDANSETRON HCL 4 MG/2ML IJ SOLN
4.0000 mg | Freq: Once | INTRAMUSCULAR | Status: AC
  Administered 2018-10-23: 4 mg via INTRAVENOUS
  Filled 2018-10-23: qty 2

## 2018-10-23 MED ORDER — ONDANSETRON 4 MG PO TBDP: 4.0000 mg | ORAL_TABLET | Freq: Three times a day (TID) | ORAL | 0 refills | Status: DC | PRN

## 2018-10-23 MED ORDER — CEFTRIAXONE SODIUM 250 MG IJ SOLR
250.0000 mg | Freq: Once | INTRAMUSCULAR | Status: AC
  Administered 2018-10-23: 11:00:00 250 mg via INTRAMUSCULAR
  Filled 2018-10-23: qty 250

## 2018-10-23 NOTE — ED Provider Notes (Addendum)
[facility] EMERGENCY DEPARTMENT Provider Note   CSN: [id number] Arrival date & time: 10/23/18  4098    History   Chief Complaint Chief Complaint  Patient presents with  . Headache with Diarrhea and Fever    HPI Aaron Barton is a 23 y.o. male with h/o surgical hernia repair, syphilis, anal fistula s/p seton placement on 2018, anoreceptive MSM, here for evaluation for headache.  Sudden onset yesterday.  Since has developed associated orange tinged watery diarrhea multiple episodes in the last 24 hours.  No melena. No hematochezia. No purulent anal drainage. Has some rectal pain and tenesmus. Last receptive anal intercourse 1 week ago that was not painful.  Associated with body aches, nausea, nbnb emesis x 4, chills intermittent mild left/lower abdominal discomfort.  Abdominal cramping worse before having urge to have BM, improved after BMs. Febrile in ED up to 102.  Tired to take ibuprofen last night, no medicines since. No sick contacts, has been following self isolation guidelines and not going to work but went to [facility] 2 weeks ago. No other travel otherwise. No exposure to suspected or confirmed COVID-19.  No daily medicines. No illicit drug or IVDU.  Had anal fistula treated by general surgery and GI at [facility] 2017, states was tested for Chron's and UC with biopsies 2018 normal.   No associated CP, SOB, cough, congestion, rhinorrhea, ST, dysuria, hematuria, penile discharge. HPI  History reviewed. No pertinent past medical history.  There are no active problems to display for this patient.   History reviewed. No pertinent surgical history.      Home Medications    Prior to Admission medications   Medication Sig Start Date End Date Taking? Authorizing Provider  dicyclomine (BENTYL) 20 MG tablet Take 1 tablet (20 mg total) by mouth 2 (two) times daily. 03/04/18   Dahlia Byes A, NP  doxycycline (VIBRAMYCIN) 100 MG capsule Take 1 capsule (100 mg total) by mouth 2  (two) times daily. 10/23/18   Liberty Handy, PA-C  hydrocortisone (ANUSOL-HC) 25 MG suppository Place 1 suppository (25 mg total) rectally 2 (two) times daily. 09/24/16   Dorena Bodo, NP  ondansetron (ZOFRAN ODT) 4 MG disintegrating tablet Take 1 tablet (4 mg total) by mouth every 8 (eight) hours as needed for nausea or vomiting. 10/23/18   Liberty Handy, PA-C    Family History Family History  Problem Relation Age of Onset  . Healthy Mother   . Healthy Father     Social History Social History   Tobacco Use  . Smoking status: Never Smoker  . Smokeless tobacco: Never Used  Substance Use Topics  . Alcohol use: No  . Drug use: No     Allergies   Pineapple and Vancomycin   Review of Systems Review of Systems   Physical Exam Updated Vital Signs BP 97/63   Pulse (!) 102   Temp 99.5 F (37.5 C) (Oral)   Resp 14   Ht  (1.702 m)   Wt 67.1 kg   SpO2 100%   BMI 23.18 kg/m   Physical Exam Vitals signs and nursing note reviewed.  Constitutional:      Appearance: He is well-developed.     Comments: Actively vomiting but overall non toxic   HENT:     Head: Normocephalic and atraumatic.     Nose: Nose normal.     Mouth/Throat:     Comments: MMM Eyes:     Conjunctiva/sclera: Conjunctivae normal.  Neck:  Musculoskeletal: Normal range of motion.  Cardiovascular:     Rate and Rhythm: Normal rate and regular rhythm.     Heart sounds: Normal heart sounds.  Pulmonary:     Effort: Pulmonary effort is normal.     Breath sounds: Normal breath sounds.  Abdominal:     General: Bowel sounds are normal.     Palpations: Abdomen is soft.     Tenderness: There is abdominal tenderness.     Comments: Mild left mid/lower and suprapubic tenderness without G/R/R. No CVA tenderness. Negative Murphy's and McBurney's. Active BS to lower quadrants   Genitourinary:    Comments:  RN was present.  Perianal skin normal without ulcers, tenderness, fluctuance. No external  fissures or external hemorrhoids noted. No induration or swelling of the perianal skin.  Deferred internal exam.  Musculoskeletal: Normal range of motion.  Skin:    General: Skin is warm and dry.     Capillary Refill: Capillary refill takes less than 2 seconds.  Neurological:     Mental Status: He is alert.  Psychiatric:        Behavior: Behavior normal.      ED Treatments / Results  Labs (all labs ordered are listed, but only abnormal results are displayed) Labs Reviewed  CBC WITH DIFFERENTIAL/PLATELET - Abnormal; Notable for the following components:      Result Value   Lymphs Abs 0.4 (*)    All other components within normal limits  COMPREHENSIVE METABOLIC PANEL - Abnormal; Notable for the following components:   Glucose, Bld 121 (*)    Creatinine, Ser 1.29 (*)    All other components within normal limits  URINALYSIS, ROUTINE W REFLEX MICROSCOPIC - Abnormal; Notable for the following components:   Protein, ur 30 (*)    All other components within normal limits  GASTROINTESTINAL PANEL BY PCR, STOOL (REPLACES STOOL CULTURE)  LIPASE, BLOOD  RAPID HIV SCREEN (HIV 1/2 AB+AG)  LACTIC ACID, PLASMA  LACTIC ACID, PLASMA  HEPATITIS PANEL, ACUTE  RPR  GC/CHLAMYDIA PROBE AMP ([place]) NOT AT [facility]    EKG None  Radiology Ct Abdomen Pelvis W Contrast  Result Date: 10/23/2018 CLINICAL DATA:  Diarrhea and fever. History of anal fissure several years ago. EXAM: CT ABDOMEN AND PELVIS WITH CONTRAST TECHNIQUE: Multidetector CT imaging of the abdomen and pelvis was performed using the standard protocol following bolus administration of intravenous contrast. CONTRAST:  OMNIPAQUE IOHEXOL 300 MG/ML  SOLN COMPARISON:  None. FINDINGS: Lower chest: Limited visualization of lower thorax is negative for focal airspace opacity or pleural effusion. Normal heart size.  No pericardial effusion. Hepatobiliary: Normal hepatic contour. No discrete hepatic lesions. There is a approximately 0.8 x  0.5 cm stone lying dependently within otherwise normal-appearing gallbladder. No gallbladder wall thickening or pericholecystic fluid. No intra or extrahepatic biliary duct dilatation. No ascites. Pancreas: Normal appearance of the pancreas. Spleen: Normal appearance of the spleen. Adrenals/Urinary Tract: There is symmetric enhancement of the bilateral kidneys. No definite renal stones this postcontrast examination. No discrete renal lesions. No urine obstruction or perinephric stranding. Normal appearance the bilateral adrenal glands. Normal appearance of the urinary bladder given degree distention. Stomach/Bowel: Bowel is normal in course and caliber without wall thickening or evidence of enteric obstruction. The appendix is not visualized, however there is no pericecal inflammatory change. Normal appearance of the rectum and anus. The adjacent perirectal fascial planes appear preserved. No adjacent perirectal stranding. No pneumoperitoneum, pneumatosis or portal venous gas. Vascular/Lymphatic: Normal caliber the abdominal aorta. The  major branch vessels of the abdominal aorta appear widely patent on this non CTA examination. No bulky retroperitoneal, mesenteric, pelvic or inguinal lymphadenopathy. Reproductive: Normal appearance the prostate gland. No free fluid in the pelvic cul-de-sac. Other: Regional soft tissues appear normal. Musculoskeletal: No acute or aggressive osseous abnormalities. IMPRESSION: 1. No explanation for patient's diarrhea and fever with special attention paid to the rectum and anus. No evidence of enteric or urinary obstruction. 2. Cholelithiasis without evidence of cholecystitis. Electronically Signed   By: Simonne Come M.D.   On: 10/23/2018 10:26    Procedures Procedures (including critical care time)  Medications Ordered in ED Medications  sterile water (preservative free) injection (has no administration in time range)  ondansetron (ZOFRAN) injection 4 mg (4 mg Intravenous Given  10/23/18 0837)  sodium chloride 0.9 % bolus 500 mL (0 mLs Intravenous Stopped 10/23/18 0934)  acetaminophen (TYLENOL) tablet 650 mg (650 mg Oral Given 10/23/18 0850)  cefTRIAXone (ROCEPHIN) injection 250 mg (250 mg Intramuscular Given 10/23/18 1049)  doxycycline (VIBRA-TABS) tablet 100 mg (100 mg Oral Given 10/23/18 1048)  iohexol (OMNIPAQUE) 300 MG/ML solution 100 mL (100 mLs Intravenous Contrast Given 10/23/18 1019)     Initial Impression / Assessment and Plan / ED Course  I have reviewed the triage vital signs and the nursing notes.  Pertinent labs & imaging results that were available during my care of the patient were reviewed by me and considered in my medical decision making (see chart for details).  Clinical Course as of Oct 23 1123  Sun Oct 23, 2018  0904 Creatinine(!): 1.29 [CG]  1039 IMPRESSION: 1. No explanation for patient's diarrhea and fever with special attention paid to the rectum and anus. No evidence of enteric or urinary obstruction. 2. Cholelithiasis without evidence of cholecystitis.  CT ABDOMEN PELVIS W CONTRAST [CG]    Clinical Course User Index [CG] Liberty Handy, PA-C   Tahji Filipovich was evaluated in Emergency Department on 10/23/2018 for the symptoms described in the history of present illness. He was evaluated in the context of the global COVID-19 pandemic, which necessitated consideration that the patient might be at risk for infection with the SARS-CoV-2 virus that causes COVID-19. Institutional protocols and algorithms that pertain to the evaluation of patients at risk for COVID-19 are in a state of rapid change based on information released by regulatory bodies including the CDC and federal and state organizations. These policies and algorithms were followed during the patient's care in the ED.      ddx viral gastro vs prostatitis vs diverticulitis vs bacterial diarrhea. Given sexual practices will add hepatitis panel, RPR, anus GC/chlamydia swabs, stool samples,  UA.   1045: Labs and UA unremarkable. CTAP vastly reassuring. Repeat abd exam improved. No episodes of emesis since zofran.  Tachycardia and fever improved.  Tolerating PO.  Discussed results and imaging with patient.  He received ceftriaxone and doxy in ED for most likely prostatitis, which I explained to patient.  Will dc with doxycycline, zofran, tylenol.  This could still be viral gastro vs infectious diarrhea but considered less likely.  Suspicion overall low for COVID but we discussed given outbreak we will dc with isolation instructions.  He is comfortable with this and aware of s/s that would warrant ED re-evaluation.  Pending gc/chlamydia, RPR, stool studies at discharge.  Final Clinical Impressions(s) / ED Diagnoses   Final diagnoses:  Nausea vomiting and diarrhea  Tenesmus    ED Discharge Orders  Ordered    doxycycline (VIBRAMYCIN) 100 MG capsule  2 times daily     10/23/18 1052    ondansetron (ZOFRAN ODT) 4 MG disintegrating tablet  Every 8 hours PRN     10/23/18 1052            Liberty Handy, PA-C 10/23/18 1125    Shaune Pollack, MD 10/24/18 1943

## 2018-10-23 NOTE — Discharge Instructions (Addendum)
You were seen in the ED for nausea, vomiting, diarrhea, abdominal pain, headaches, body aches, fever, urge to defecate.   Labs and CT are normal.  HIV test is negative.  We are waiting for syphilis, gonorrhea and chlamydia test, and stool studies.    We suspect your symptom are most likely from proctitis or inflammation/infection of rectum and anus from intercourse.  This is treated with doxycyline, take as prescribed.   It is less likely that this is COVID-19 (coronavirus).  Given recent outbreak, we recommend self isolation for a total of 7 days since onset of symptoms AND no fever for a total of 3 days.  After this period, you may resume essential activities.   Avoid intercourse until symptoms resolve. Always use condoms.   Return for worsening symptoms, blood or pus from anus, pain in rectum, persistent vomiting, dehydration.

## 2018-10-23 NOTE — ED Triage Notes (Addendum)
Patient reports headache with multiple diarrhea onset yesterday , denies emesis , febrile with chills at arrival , denies recent travel / no sick contact . Denies cough or SOB .

## 2018-10-23 NOTE — ED Provider Notes (Signed)
Medical screening examination/treatment/procedure(s) were conducted as a shared visit with non-physician practitioner(s) and myself.  I personally evaluated the patient during the encounter. Briefly, the patient is a 23 yo M here w/ rectal pain, abd pain, fever. H/o recurrent visits for same. Concern for colitis, proctitis, possible STD given receptive anal intercourse. HIV, labs sent. H/o perirectal abscess as well, will check CT. No cough, no other sx to suggest COVID. Will give fluids, sx control, re-assess.   EKG Interpretation None          Shaune Pollack, MD 10/23/18 [phone]

## 2018-10-24 ENCOUNTER — Encounter (HOSPITAL_COMMUNITY): Payer: Self-pay | Admitting: Emergency Medicine

## 2018-10-24 ENCOUNTER — Telehealth (HOSPITAL_COMMUNITY): Payer: Self-pay | Admitting: Emergency Medicine

## 2018-10-24 ENCOUNTER — Telehealth (HOSPITAL_COMMUNITY): Payer: Self-pay

## 2018-10-24 DIAGNOSIS — A071 Giardiasis [lambliasis]: Secondary | ICD-10-CM

## 2018-10-24 LAB — HEPATITIS PANEL, ACUTE
HCV Ab: <0.1 s/co ratio (ref 0.0–0.9)
Hep A IgM: NEGATIVE
Hep B C IgM: NEGATIVE
Hepatitis B Surface Ag: NEGATIVE

## 2018-10-24 LAB — RPR: RPR Ser Ql: REACTIVE — AB

## 2018-10-24 LAB — GASTROINTESTINAL PANEL BY PCR, STOOL (REPLACES STOOL CULTURE)
Adenovirus F40/41: NOT DETECTED
Astrovirus: NOT DETECTED
Campylobacter species: NOT DETECTED
Cryptosporidium: NOT DETECTED
Cyclospora cayetanensis: NOT DETECTED
Entamoeba histolytica: NOT DETECTED
Enteroaggregative E coli (EAEC): NOT DETECTED
Enteropathogenic E coli (EPEC): NOT DETECTED
Enterotoxigenic E coli (ETEC): NOT DETECTED
Giardia lamblia: DETECTED — AB
Norovirus GI/GII: NOT DETECTED
Plesimonas shigelloides: NOT DETECTED
Rotavirus A: NOT DETECTED
Salmonella species: NOT DETECTED
Sapovirus (I, II, IV, and V): NOT DETECTED
Shiga like toxin producing E coli (STEC): NOT DETECTED
Shigella/Enteroinvasive E coli (EIEC): DETECTED — AB
Vibrio cholerae: NOT DETECTED
Vibrio species: NOT DETECTED
Yersinia enterocolitica: NOT DETECTED

## 2018-10-24 LAB — RPR, QUANT+TP ABS (REFLEX)
Rapid Plasma Reagin, Quant: 1:2 {titer} — ABNORMAL HIGH
T Pallidum Abs: REACTIVE — AB

## 2018-10-24 LAB — GC/CHLAMYDIA PROBE AMP (~~LOC~~) NOT AT ARMC
Chlamydia: NEGATIVE
Neisseria Gonorrhea: NEGATIVE

## 2018-10-24 MED ORDER — METRONIDAZOLE 250 MG PO TABS: 250.0000 mg | ORAL_TABLET | Freq: Three times a day (TID) | ORAL | 0 refills | Status: AC

## 2018-10-24 NOTE — Telephone Encounter (Signed)
Error, see earlier note.

## 2018-10-24 NOTE — Telephone Encounter (Signed)
Notified of positive GI Panel for Enteroinvasive E.Coli/Shigella, as well as Giardiasis. Reviewed his labs - HIV is negative. Given multiple organisms, will discuss with Infectious Disease MD on call.  9:35 AM Discussed with Dr. Daiva Eves of ID, greatly appreciate reccs. Will continue empiric tx for STD, also add on Flagyl 250 TID x 7 days. Will try to add on HIV RNA as well. Will forward his info to Dr. Daiva Eves, have pt follow-up.  Discussed with patient via telephone. He continues to have diarrhea but is otherwise feeling well. No fevers. I discussed his diagnosis, need for new ABX in addition to his Doxy, as well as need for f/u with ID for discussion of PreP and further management. He understands and is thankful for care.

## 2018-10-25 ENCOUNTER — Telehealth: Payer: Self-pay | Admitting: *Deleted

## 2018-10-25 NOTE — Telephone Encounter (Signed)
-----   Message from Randall Hiss, MD sent at 10/24/2018  3:43 PM EDT ----- Regarding: RE: ED Patient No problem'  Can we get this gentleman into our PrEP clinic? Vs visit with provider ----- Message ----- From: Shaune Pollack, MD Sent: 10/24/2018  10:08 AM EDT To: Randall Hiss, MD Subject: ED Patient                                     Here's the info re: Aaron Barton, who was here w/ high-risk sexual activity, +Giardia, +EIEC. I was able to get in touch with him and have called in Flagyl, but unfortunately was unable to add on HIV Quant. Remainder of labs are still pending. He's on Doxy as well for possible chlamydia proctitis (GC/C anal swab pending).  Thanks!  Shaune Pollack

## 2018-10-25 NOTE — Telephone Encounter (Signed)
Claris Che, can you please reach out to Mr. Kilday to offer him a follow up appointment for PrEP with Cassie?  Any available appointment slot is fine, per Cassie. Thanks!  Marcelino Duster

## 2018-11-01 ENCOUNTER — Telehealth: Payer: Self-pay | Admitting: Infectious Disease

## 2018-11-01 ENCOUNTER — Encounter: Payer: Self-pay | Admitting: Pharmacist

## 2018-11-01 NOTE — Telephone Encounter (Signed)
COVID-19 Pre-Screening Questions: ° °Do you currently have a fever (>100 °F), chills or unexplained body aches? no  ° °Are you currently experiencing new cough, shortness of breath, sore throat, runny nose?no  °•  °Have you recently travelled outside the state of [place] in the last 14 days? no °•  °Have you been in contact with someone that is currently pending confirmation of Covid19 testing or has been confirmed to have the Covid19 virus? no °

## 2018-11-02 ENCOUNTER — Encounter: Payer: Self-pay | Admitting: Pharmacist

## 2018-11-02 ENCOUNTER — Telehealth: Payer: Self-pay | Admitting: Pharmacy Technician

## 2018-11-02 ENCOUNTER — Other Ambulatory Visit: Payer: Self-pay

## 2018-11-02 ENCOUNTER — Ambulatory Visit (INDEPENDENT_AMBULATORY_CARE_PROVIDER_SITE_OTHER): Payer: Self-pay | Admitting: Pharmacist

## 2018-11-02 DIAGNOSIS — Z7252 High risk homosexual behavior: Secondary | ICD-10-CM

## 2018-11-02 NOTE — Telephone Encounter (Signed)
[facility] Patient Advocate Encounter ° °Insurance verification completed.   ° °The patient is uninsured and will need patient assistance for medication. ° °We can complete the application and will need to meet with the patient for signatures and income documentation. ° °Joani Cosma E. Emmogene Simson, CPhT °Specialty Pharmacy Patient Advocate °[facility] °Phone: [phone] °Fax:  [phone] ° ° °

## 2018-11-02 NOTE — Progress Notes (Signed)
Date:  11/03/2018   HPI: Aaron Barton is a 23 y.o. male who presents to the [facility] pharmacy clinic to discuss and initiate PrEP.  Insured   []    Uninsured  []    There are no active problems to display for this patient.   Patient's Medications  New Prescriptions   No medications on file  Previous Medications   DICYCLOMINE (BENTYL) 20 MG TABLET    Take 1 tablet (20 mg total) by mouth 2 (two) times daily.   DOXYCYCLINE (VIBRAMYCIN) 100 MG CAPSULE    Take 1 capsule (100 mg total) by mouth 2 (two) times daily.   EMTRICITABINE-TENOFOVIR AF (DESCOVY) 200-25 MG TABLET    Take 1 tablet by mouth daily.   HYDROCORTISONE (ANUSOL-HC) 25 MG SUPPOSITORY    Place 1 suppository (25 mg total) rectally 2 (two) times daily.   ONDANSETRON (ZOFRAN ODT) 4 MG DISINTEGRATING TABLET    Take 1 tablet (4 mg total) by mouth every 8 (eight) hours as needed for nausea or vomiting.  Modified Medications   No medications on file  Discontinued Medications   No medications on file    Allergies: Allergies  Allergen Reactions  . Pineapple Itching  . Vancomycin Hives    Past Medical History: No past medical history on file.  Social History: Social History   Socioeconomic History  . Marital status: Single    Spouse name: Not on file  . Number of children: Not on file  . Years of education: Not on file  . Highest education level: Not on file  Occupational History  . Not on file  Social Needs  . Financial resource strain: Not on file  . Food insecurity:    Worry: Not on file    Inability: Not on file  . Transportation needs:    Medical: Not on file    Non-medical: Not on file  Tobacco Use  . Smoking status: Never Smoker  . Smokeless tobacco: Never Used  Substance and Sexual Activity  . Alcohol use: No  . Drug use: No  . Sexual activity: Not on file  Lifestyle  . Physical activity:    Days per week: Not on file    Minutes per session: Not on file  . Stress: Not on file  Relationships  .  Social connections:    Talks on phone: Not on file    Gets together: Not on file    Attends religious service: Not on file    Active member of club or organization: Not on file    Attends meetings of clubs or organizations: Not on file    Relationship status: Not on file  Other Topics Concern  . Not on file  Social History Narrative  . Not on file    No flowsheet data found.  Labs:  SCr: Lab Results  Component Value Date   CREATININE 1.29 (H) 10/23/2018   CREATININE 1.00 05/03/2013   HIV Lab Results  Component Value Date   HIV NON REACTIVE 10/23/2018   HIV NONREACTIVE 06/23/2014   Hepatitis B Lab Results  Component Value Date   HEPBSAG Negative 10/23/2018   Hepatitis C No results found for: HEPCAB, HCVRNAPCRQN Hepatitis A No results found for: HAV RPR and STI Lab Results  Component Value Date   LABRPR Reactive (A) 10/23/2018   LABRPR NON REAC 06/23/2014    STI Results GC CT  10/23/2018 Negative Negative  06/23/2014 NG: Negative CT: Negative    Assessment: Aaron Barton was referred here today  for PrEP.  He was recently seen in the ED with headaches, diarrhea, and fever and was found to have a positive GI panel with enteroinvasive E. Coli, Shigella, and Giardiasis. The ED physician consulted with Dr. Daiva Eves and he suggested Flagyl x 7 days in addition to the doxycycline that he was discharged on.  His gonorrhea/chlamyida tests were negative but his syphilis RPR did come back reactive.  He states that he has been treated for syphilis in the past, most recently about 3 months ago where he received a dose of Bicillin. Dr. Daiva Eves initiated a referral for the patient for PrEP as he is a MSM who has receptive anal intercourse.  Aaron Barton is feeling much better and has no more fevers or diarrhea.  When starting to discuss PrEP he tells me that he is already taking Descovy for PrEP. He states that he takes it without issue and is getting it from the health department for free (he is  uninsured).  He feels completely normal and isn't having anymore issues.  He has finished both his Flagyyl and Doxycycline and did not miss any doses. I apologized for the mix up and gave him my card to call me if he ever wanted to transition his PrEP care to our clinic. I will defer to Dr. Daiva Eves to see if there is any other need for patient to come back to clinic to see a provider.  Plan: - Continue PrEP from the health department  Ruger Saxer L. Pailynn Vahey, PharmD, BCIDP, AAHIVP, CPP Infectious Diseases Clinical Pharmacist [facility] 11/03/2018, 10:07 AM

## 2018-11-04 ENCOUNTER — Encounter: Payer: Self-pay | Admitting: Pharmacist

## 2019-07-25 IMAGING — CT CT ABDOMEN AND PELVIS WITH CONTRAST
2 of 4 series · 15 of 46 positions shown, 17 images · IV contrast (omnipaque)
Comparison: None.

CLINICAL DATA: Diarrhea and fever. History of anal fissure several
years ago.

EXAM:
CT ABDOMEN AND PELVIS WITH CONTRAST
TECHNIQUE: Multidetector CT imaging of the abdomen and pelvis was performed
using the standard protocol following bolus administration of
intravenous contrast.
CONTRAST:  100mL OMNIPAQUE IOHEXOL 300 MG/ML  SOLN

[Series 3: abd/ pelvis 5.0 i30f 2 · axial · 0.59mm/px · z∈[+908,+1273]mm · 12 of 87 slices shown, 14 images]
[im 7/87  soft-tissue]
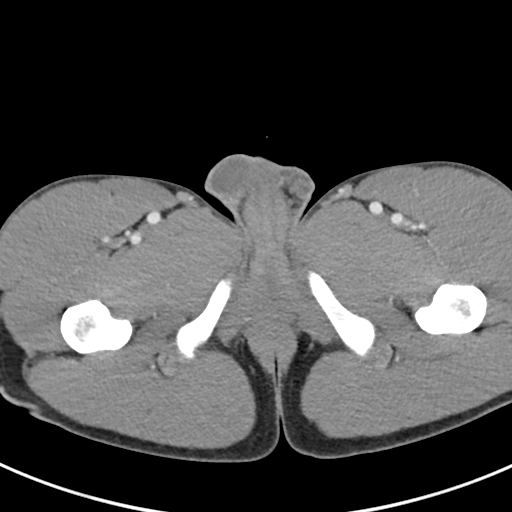
[im 7/87  bone]
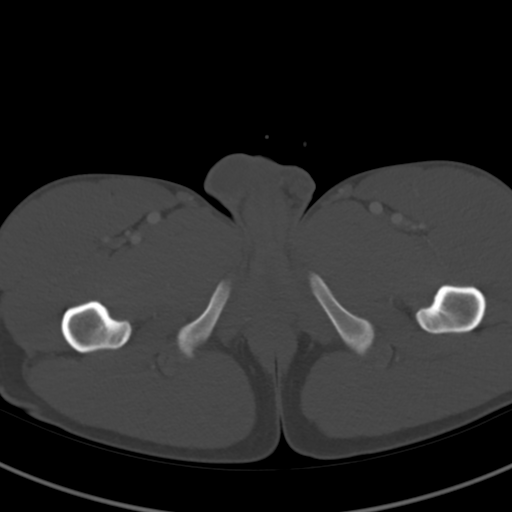
[im 14/87  soft-tissue]
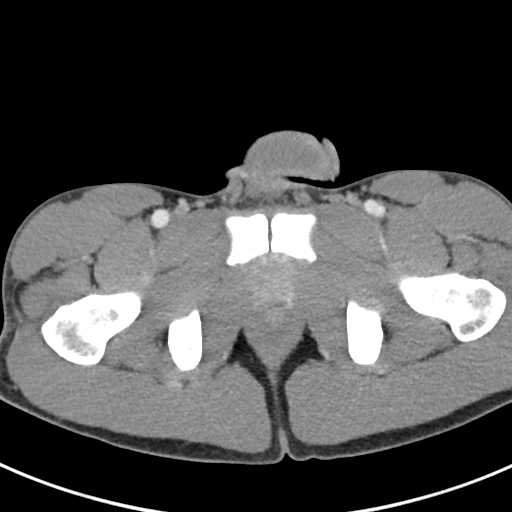
[im 20/87  soft-tissue]
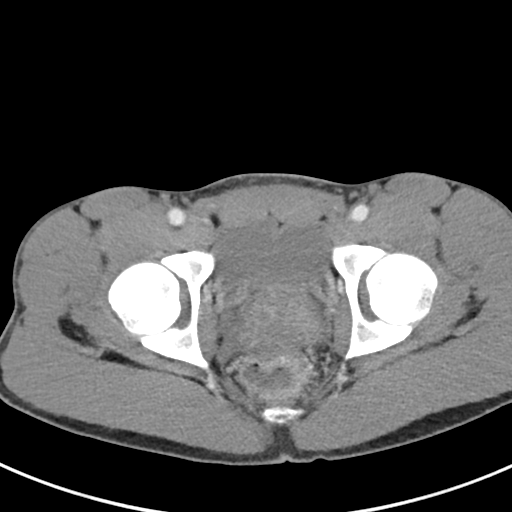
[im 27/87  soft-tissue]
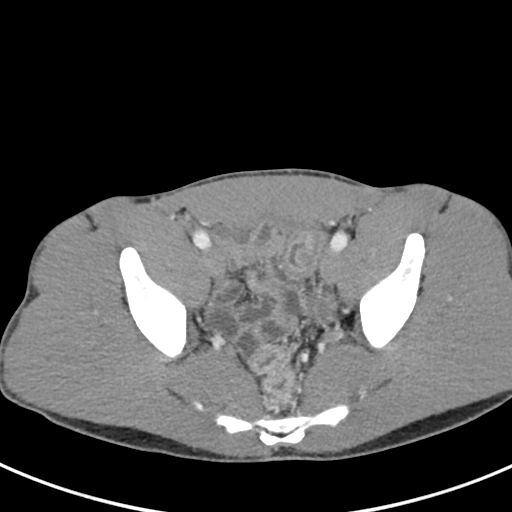
[im 34/87  soft-tissue]
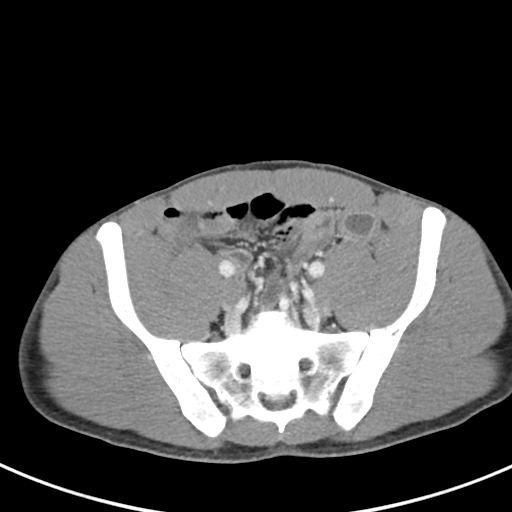
[im 40/87  soft-tissue]
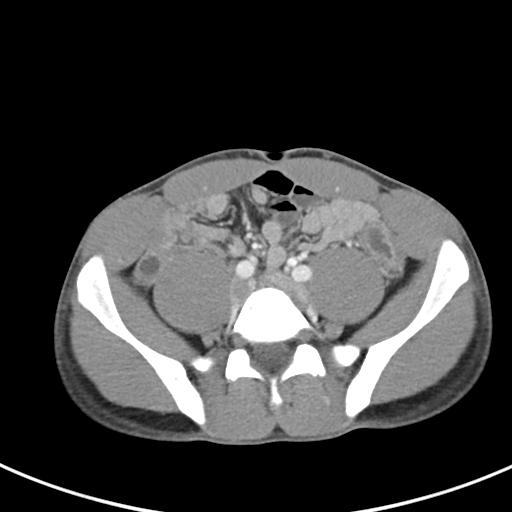
[im 47/87  soft-tissue]
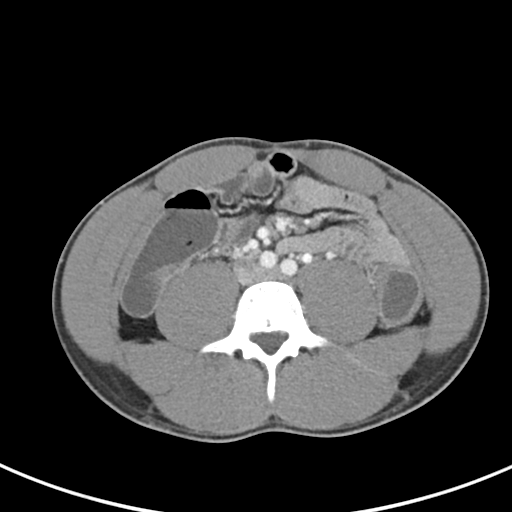
[im 53/87  soft-tissue]
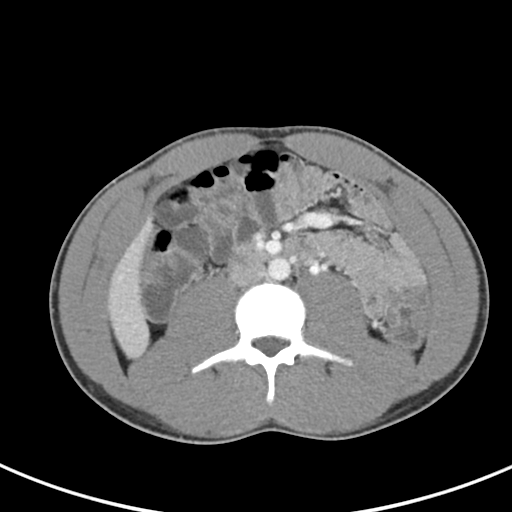
[im 60/87  soft-tissue]
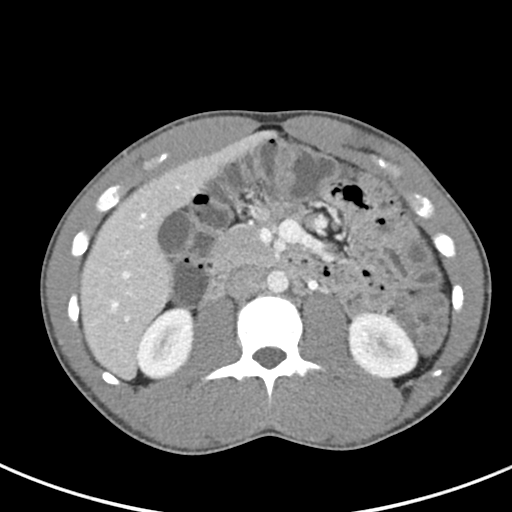
[im 60/87  bone]
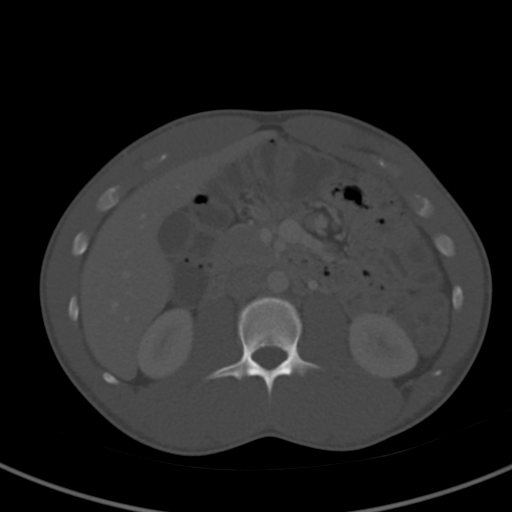
[im 67/87  soft-tissue]
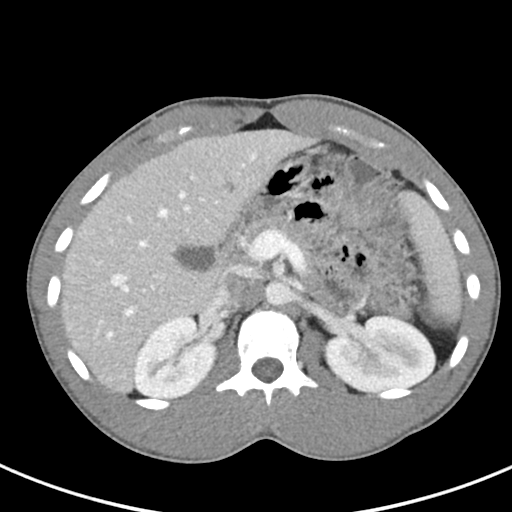
[im 73/87  soft-tissue]
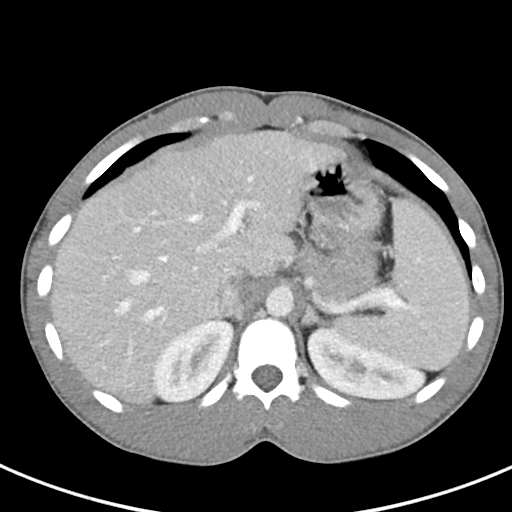
[im 80/87  soft-tissue]
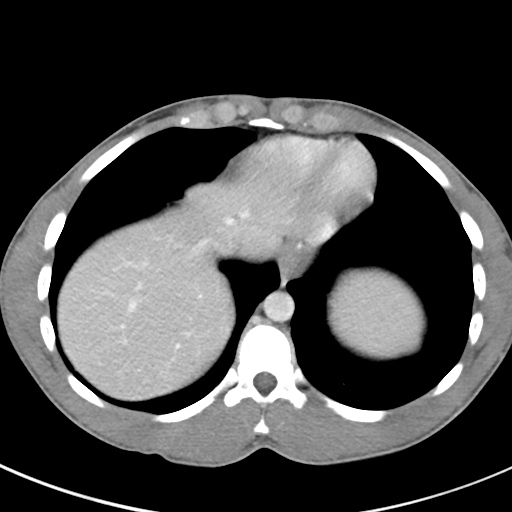

[Series 6: coronal soft tissue · coronal · 0.58mm/px · 3 of 101 slices shown]
[im 34/101  soft-tissue]
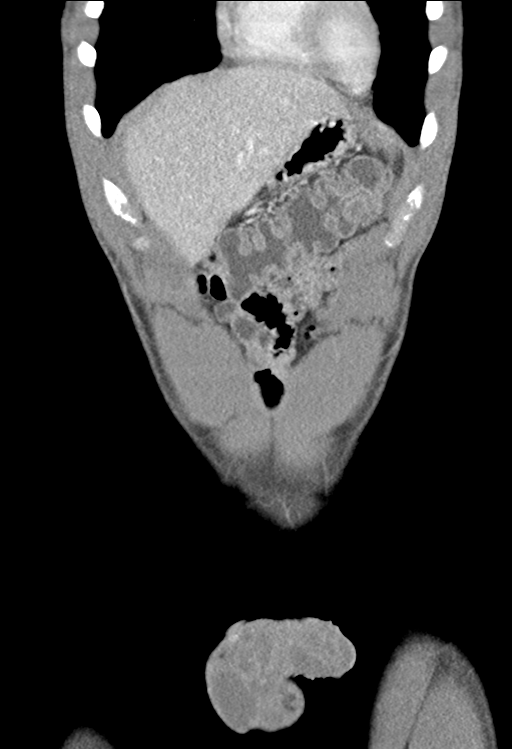
[im 45/101  soft-tissue]
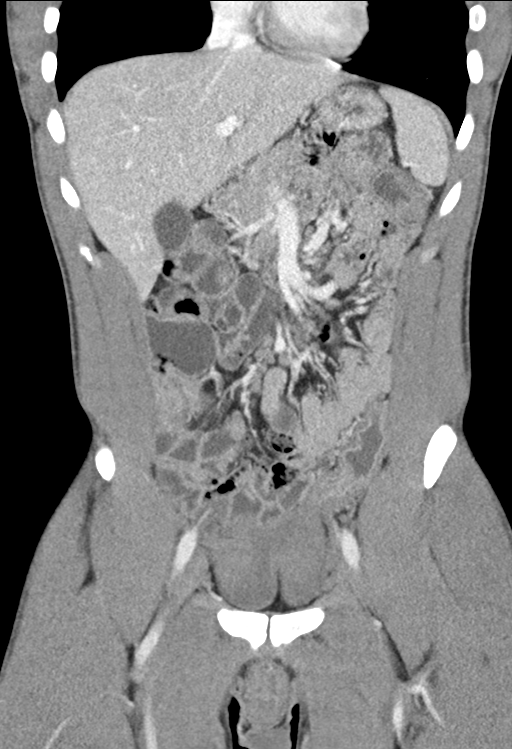
[im 56/101  soft-tissue]
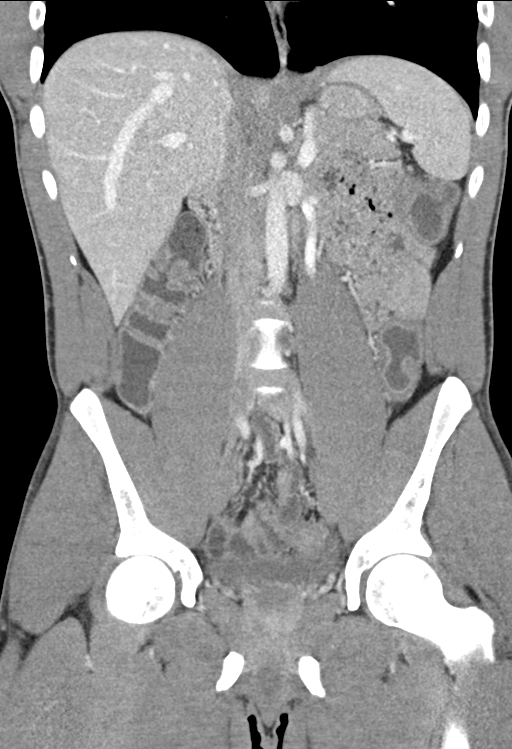

[15 of 46 positions shown; findings below may reference images not displayed]

FINDINGS: Lower chest: Limited visualization of lower thorax is negative for
focal airspace opacity or pleural effusion.

Normal heart size.  No pericardial effusion.

Hepatobiliary: Normal hepatic contour. No discrete hepatic lesions.
There is a approximately 0.8 x 0.5 cm stone lying dependently within
otherwise normal-appearing gallbladder. No gallbladder wall
thickening or pericholecystic fluid. No intra or extrahepatic
biliary duct dilatation. No ascites.

Pancreas: Normal appearance of the pancreas.

Spleen: Normal appearance of the spleen.

Adrenals/Urinary Tract: There is symmetric enhancement of the
bilateral kidneys. No definite renal stones this postcontrast
examination. No discrete renal lesions. No urine obstruction or
perinephric stranding.

Normal appearance the bilateral adrenal glands.

Normal appearance of the urinary bladder given degree distention.

Stomach/Bowel: Bowel is normal in course and caliber without wall
thickening or evidence of enteric obstruction. The appendix is not
visualized, however there is no pericecal inflammatory change.
Normal appearance of the rectum and anus. The adjacent perirectal
fascial planes appear preserved. No adjacent perirectal stranding.
No pneumoperitoneum, pneumatosis or portal venous gas.

Vascular/Lymphatic: Normal caliber the abdominal aorta. The major
branch vessels of the abdominal aorta appear widely patent on this
non CTA examination.

No bulky retroperitoneal, mesenteric, pelvic or inguinal
lymphadenopathy.

Reproductive: Normal appearance the prostate gland. No free fluid in
the pelvic cul-de-sac.

Other: Regional soft tissues appear normal.

Musculoskeletal: No acute or aggressive osseous abnormalities.
IMPRESSION: 1. No explanation for patient's diarrhea and fever with special
attention paid to the rectum and anus. No evidence of enteric or
urinary obstruction.
2. Cholelithiasis without evidence of cholecystitis.

## 2023-03-05 ENCOUNTER — Ambulatory Visit
Admission: EM | Admit: 2023-03-05 | Discharge: 2023-03-05 | Disposition: A | Discharge status: Home / Self Care | Payer: BC Managed Care – PPO | Attending: Emergency Medicine | Admitting: Emergency Medicine

## 2023-03-05 ENCOUNTER — Encounter: Payer: Self-pay | Admitting: Emergency Medicine

## 2023-03-05 DIAGNOSIS — Z1152 Encounter for screening for COVID-19: Secondary | ICD-10-CM | POA: Insufficient documentation

## 2023-03-05 DIAGNOSIS — J069 Acute upper respiratory infection, unspecified: Secondary | ICD-10-CM | POA: Diagnosis present

## 2023-03-05 LAB — GROUP A STREP BY PCR: Group A Strep by PCR: NOT DETECTED

## 2023-03-05 LAB — SARS CORONAVIRUS 2 BY RT PCR: SARS Coronavirus 2 by RT PCR: NEGATIVE

## 2023-03-05 NOTE — ED Triage Notes (Signed)
Patient c/o achiness in his lower back, headache, and sore throat that started yesterday.  Patient denies fevers.

## 2023-03-05 NOTE — ED Provider Notes (Signed)
[facility]    CSN: [id number] Arrival date & time: 03/05/23  1218      History   Chief Complaint Chief Complaint  Patient presents with   Sore Throat   Back Pain    HPI Aaron Barton is a 27 y.o. male.   Patient presents for evaluation of chills, mild nasal congestion, sore throat, lower back pain and intermittent generalized headaches beginning 1 day ago.  Associated nausea without vomiting.  Decreased appetite with poor food intake but tolerating fluids.  No known sick contacts.  Has not attempted treatment.  Denies presence of cough.  Denies respiratory history, non-smoker.    History reviewed. No pertinent past medical history.  There are no problems to display for this patient.   History reviewed. No pertinent surgical history.     Home Medications    Prior to Admission medications   Medication Sig Start Date End Date Taking? Authorizing Provider  dicyclomine (BENTYL) 20 MG tablet Take 1 tablet (20 mg total) by mouth 2 (two) times daily. 03/04/18   Dahlia Byes A, NP  doxycycline (VIBRAMYCIN) 100 MG capsule Take 1 capsule (100 mg total) by mouth 2 (two) times daily. 10/23/18   Liberty Handy, PA-C  emtricitabine-tenofovir AF (DESCOVY) 200-25 MG tablet Take 1 tablet by mouth daily.    [provider]  hydrocortisone (ANUSOL-HC) 25 MG suppository Place 1 suppository (25 mg total) rectally 2 (two) times daily. 09/24/16   Dorena Bodo, NP  ondansetron (ZOFRAN ODT) 4 MG disintegrating tablet Take 1 tablet (4 mg total) by mouth every 8 (eight) hours as needed for nausea or vomiting. 10/23/18   Liberty Handy, PA-C    Family History Family History  Problem Relation Age of Onset   Healthy Mother    Healthy Father     Social History Social History   Tobacco Use   Smoking status: Never   Smokeless tobacco: Never  Vaping Use   Vaping status: Never Used  Substance Use Topics   Alcohol use: No   Drug use: No     Allergies   Pineapple  and Vancomycin   Review of Systems Review of Systems  Constitutional:  Positive for chills. Negative for activity change, appetite change, diaphoresis, fatigue, fever and unexpected weight change.  HENT:  Positive for congestion and sore throat. Negative for dental problem, drooling, ear discharge, ear pain, facial swelling, hearing loss, mouth sores, nosebleeds, postnasal drip, rhinorrhea, sinus pressure, sinus pain, sneezing, tinnitus, trouble swallowing and voice change.   Respiratory: Negative.    Cardiovascular: Negative.   Gastrointestinal:  Positive for nausea. Negative for abdominal distention, abdominal pain, anal bleeding, blood in stool, constipation, diarrhea, rectal pain and vomiting.  Musculoskeletal:  Positive for back pain. Negative for arthralgias, gait problem, joint swelling, myalgias, neck pain and neck stiffness.  Neurological:  Positive for headaches. Negative for dizziness, tremors, seizures, syncope, facial asymmetry, speech difficulty, weakness, light-headedness and numbness.     Physical Exam Triage Vital Signs ED Triage Vitals  Encounter Vitals Group     BP 03/05/23 1230 123/80     Systolic BP Percentile --      Diastolic BP Percentile --      Pulse Rate 03/05/23 1230 (!) 111     Resp 03/05/23 1230 15     Temp 03/05/23 1230 100.1 F (37.8 C)     Temp Source 03/05/23 1230 Oral     SpO2 03/05/23 1230 96 %     Weight 03/05/23 1228 145  lb (65.8 kg)     Height 03/05/23 1228 5\' 7"  (1.702 m)     Head Circumference --      Peak Flow --      Pain Score 03/05/23 1228 8     Pain Loc --      Pain Education --      Exclude from Growth Chart --    No data found.  Updated Vital Signs BP 123/80 (BP Location: Right Arm)   Pulse (!) 111   Temp 100.1 F (37.8 C) (Oral)   Resp 15   Ht 5\' 7"  (1.702 m)   Wt 145 lb (65.8 kg)   SpO2 96%   BMI 22.71 kg/m   Visual Acuity Right Eye Distance:   Left Eye Distance:   Bilateral Distance:    Right Eye Near:   Left  Eye Near:    Bilateral Near:     Physical Exam Constitutional:      Appearance: Normal appearance.  HENT:     Head: Normocephalic.     Right Ear: Tympanic membrane, ear canal and external ear normal.     Left Ear: Tympanic membrane, ear canal and external ear normal.     Nose: No congestion or rhinorrhea.     Mouth/Throat:     Mouth: Mucous membranes are moist.     Pharynx: Posterior oropharyngeal erythema present.  Cardiovascular:     Rate and Rhythm: Normal rate and regular rhythm.  Neurological:     Mental Status: He is alert.      UC Treatments / Results  Labs (all labs ordered are listed, but only abnormal results are displayed) Labs Reviewed  GROUP A STREP BY PCR  SARS CORONAVIRUS 2 BY RT PCR    EKG   Radiology No results found.  Procedures Procedures (including critical care time)  Medications Ordered in UC Medications - No data to display  Initial Impression / Assessment and Plan / UC Course  I have reviewed the triage vital signs and the nursing notes.  Pertinent labs & imaging results that were available during my care of the patient were reviewed by me and considered in my medical decision making (see chart for details).  Viral URI   Patient is in no signs of distress nor toxic appearing.  Vital signs are stable.  Low suspicion for pneumonia, pneumothorax or bronchitis and therefore will defer imaging.  COVID and strep testing negative.May use additional over-the-counter medications as needed for supportive care.  May follow-up with urgent care as needed if symptoms persist or worsen.   Final Clinical Impressions(s) / UC Diagnoses   Final diagnoses:  None   Discharge Instructions   None    ED Prescriptions   None    PDMP not reviewed this encounter.   Valinda Hoar, NP 03/05/23 1329

## 2023-03-05 NOTE — Discharge Instructions (Addendum)
Your symptoms today are most likely being caused by a virus and should steadily improve in time it can take up to 7 to 10 days before you truly start to see a turnaround however things will get better  Covid and strep negative      You can take Tylenol and/or Ibuprofen as needed for fever reduction and pain relief.   For cough: honey 1/2 to 1 teaspoon (you can dilute the honey in water or another fluid).  You can also use guaifenesin and dextromethorphan for cough. You can use a humidifier for chest congestion and cough.  If you don't have a humidifier, you can sit in the bathroom with the hot shower running.      For sore throat: try warm salt water gargles, cepacol lozenges, throat spray, warm tea or water with lemon/honey, popsicles or ice, or OTC cold relief medicine for throat discomfort.   For congestion: take a daily anti-histamine like Zyrtec, Claritin, and a oral decongestant, such as pseudoephedrine.  You can also use Flonase 1-2 sprays in each nostril daily.   It is important to stay hydrated: drink plenty of fluids (water, gatorade/powerade/pedialyte, juices, or teas) to keep your throat moisturized and help further relieve irritation/discomfort.

## 2023-09-21 ENCOUNTER — Ambulatory Visit
Admission: EM | Admit: 2023-09-21 | Discharge: 2023-09-21 | Disposition: A | Discharge status: Home / Self Care | Attending: Emergency Medicine | Admitting: Emergency Medicine

## 2023-09-21 ENCOUNTER — Ambulatory Visit (INDEPENDENT_AMBULATORY_CARE_PROVIDER_SITE_OTHER)

## 2023-09-21 DIAGNOSIS — M79672 Pain in left foot: Secondary | ICD-10-CM

## 2023-09-21 DIAGNOSIS — M7672 Peroneal tendinitis, left leg: Secondary | ICD-10-CM

## 2023-09-21 MED ORDER — PREDNISONE 10 MG (21) PO TBPK: ORAL_TABLET | ORAL | 0 refills | Status: DC

## 2023-09-21 NOTE — ED Provider Notes (Addendum)
 [facility]    CSN: [id number] Arrival date & time: 09/21/23  1008      History   Chief Complaint Chief Complaint  Patient presents with   Foot Pain   Leg Pain    HPI Aaron Barton is a 28 y.o. male.   HPI  28 year old male with no significant past medical history presents for evaluation of pain in the lateral aspect of his left foot.  The pain began after he ran a 5K over the weekend.  He reports that it was a spur of the moment run but he does run often.  His shoes are approximately 18-year-old.  He denies any injury.  He states that he has pain at rest and with ambulation and he feels a pulling up the outside of his left leg.  No swelling or bruising noted.  History reviewed. No pertinent past medical history.  There are no active problems to display for this patient.   Past Surgical History:  Procedure Laterality Date   HERNIA REPAIR         Home Medications    Prior to Admission medications   Medication Sig Start Date End Date Taking? Authorizing Provider  predniSONE (STERAPRED UNI-PAK 21 TAB) 10 MG (21) TBPK tablet Take 6 tablets on day 1, 5 tablets day 2, 4 tablets day 3, 3 tablets day 4, 2 tablets day 5, 1 tablet day 6 09/21/23  Yes Becky Augusta, NP  emtricitabine-tenofovir AF (DESCOVY) 200-25 MG tablet Take 1 tablet by mouth daily.    [provider]  hydrocortisone (ANUSOL-HC) 25 MG suppository Place 1 suppository (25 mg total) rectally 2 (two) times daily. 09/24/16   Dorena Bodo, NP    Family History Family History  Problem Relation Age of Onset   Healthy Mother    Healthy Father     Social History Social History   Tobacco Use   Smoking status: Never   Smokeless tobacco: Never  Vaping Use   Vaping status: Never Used  Substance Use Topics   Alcohol use: No   Drug use: No     Allergies   Pineapple and Vancomycin   Review of Systems Review of Systems  Constitutional:  Negative for fever.  Musculoskeletal:  Positive  for arthralgias and myalgias. Negative for joint swelling.  Skin:  Negative for color change.     Physical Exam Triage Vital Signs ED Triage Vitals  Encounter Vitals Group     BP 09/21/23 1130 112/76     Systolic BP Percentile --      Diastolic BP Percentile --      Pulse Rate 09/21/23 1130 92     Resp 09/21/23 1130 16     Temp 09/21/23 1130 98.4 F (36.9 C)     Temp Source 09/21/23 1130 Oral     SpO2 09/21/23 1130 98 %     Weight --      Height --      Head Circumference --      Peak Flow --      Pain Score 09/21/23 1129 8     Pain Loc --      Pain Education --      Exclude from Growth Chart --    No data found.  Updated Vital Signs BP 112/76 (BP Location: Right Arm)   Pulse 92   Temp 98.4 F (36.9 C) (Oral)   Resp 16   SpO2 98%   Visual Acuity Right Eye Distance:   Left Eye  Distance:   Bilateral Distance:    Right Eye Near:   Left Eye Near:    Bilateral Near:     Physical Exam Vitals and nursing note reviewed.  Constitutional:      Appearance: Normal appearance.  Musculoskeletal:        General: Tenderness present. No swelling, deformity or signs of injury. Normal range of motion.  Skin:    General: Skin is warm and dry.     Capillary Refill: Capillary refill takes less than 2 seconds.     Findings: No bruising or erythema.  Neurological:     General: No focal deficit present.     Mental Status: He is alert and oriented to person, place, and time.      UC Treatments / Results  Labs (all labs ordered are listed, but only abnormal results are displayed) Labs Reviewed - No data to display  EKG   Radiology No results found.  Procedures Procedures (including critical care time)  Medications Ordered in UC Medications - No data to display  Initial Impression / Assessment and Plan / UC Course  I have reviewed the triage vital signs and the nursing notes.  Pertinent labs & imaging results that were available during my care of the patient  were reviewed by me and considered in my medical decision making (see chart for details).   Patient is a nontoxic-appearing 28 year old male presenting for evaluation of pain in the lateral aspect of his left foot near the insertion of his peroneus tendon.  The pain is both at rest and with ambulation.  It is also point tender to palpation.  No appreciable crepitus.  There is no edema, ecchymosis, or erythema noted.  Patient has no pain with palpation of the midfoot, metatarsals, phalanges, calcaneus, medial or lateral malleolus.  I suspect most likely that he has tendinitis of his peroneal tendon, however, I will obtain a radiograph of his left foot to rule out any bony abnormality.  Left foot x-rays independent reviewed and evaluated by me.  Impression: There is a lucency lateral to the cuboid but it has smooth edges and does not appear to be new.  No evident evidence of fracture or dislocation.  Radiology read is pending. Radiology impression states no acute or significant findings by plain radiograph.  I will discharge patient with a diagnosis of tendinitis of his peroneal tendon and treat him with prednisone taper over the next 6 days, ice, elevation, and rest.  Due to the fact that the patient is taken descovy there is an increased risk of renal toxicity on NSAIDs so I will not prescribe ibuprofen or Aleve at this time.  Also home physical therapy.  If his symptoms not proved he needs to follow-up with orthopedics or podiatry.   Final Clinical Impressions(s) / UC Diagnoses   Final diagnoses:  Left foot pain  Peroneal tendinitis of left lower extremity     Discharge Instructions      Your x-rays did not show any evidence of any broken bones.  As discussed earlier, the pain you are experiencing is along the path of your peroneal tendon.  I suspect that you have some inflammation as result of your most recent run.  Rest your left foot is much as possible.  Elevate your left foot is much  as possible to help decrease pain or swelling.  You may apply ice to your foot for 20 minutes at a time, 2-3 times a day as needed for pain and swelling.  Starting tomorrow morning at breakfast time begin taking the prednisone.  Take it each day at breakfast over the next 6 days.  This will decrease swelling and pain.  Follow the rehabilitation exercises given in your discharge instructions.  Consider purchasing new sneakers since you have had yours for the last year and this may be contributing to the inflammation of your tendon.  If your symptoms do not improve, or they worsen, I recommend that you follow-up either with podiatry or with orthopedics.     ED Prescriptions     Medication Sig Dispense Auth. Provider   predniSONE (STERAPRED UNI-PAK 21 TAB) 10 MG (21) TBPK tablet Take 6 tablets on day 1, 5 tablets day 2, 4 tablets day 3, 3 tablets day 4, 2 tablets day 5, 1 tablet day 6 21 tablet Becky Augusta, NP      PDMP not reviewed this encounter.   Becky Augusta, NP 09/21/23 9629    Becky Augusta, NP 09/21/23 1308

## 2023-09-21 NOTE — Discharge Instructions (Addendum)
 Your x-rays did not show any evidence of any broken bones.  As discussed earlier, the pain you are experiencing is along the path of your peroneal tendon.  I suspect that you have some inflammation as result of your most recent run.  Rest your left foot is much as possible.  Elevate your left foot is much as possible to help decrease pain or swelling.  You may apply ice to your foot for 20 minutes at a time, 2-3 times a day as needed for pain and swelling.  Starting tomorrow morning at breakfast time begin taking the prednisone.  Take it each day at breakfast over the next 6 days.  This will decrease swelling and pain.  Follow the rehabilitation exercises given in your discharge instructions.  Consider purchasing new sneakers since you have had yours for the last year and this may be contributing to the inflammation of your tendon.  If your symptoms do not improve, or they worsen, I recommend that you follow-up either with podiatry or with orthopedics.

## 2023-09-21 NOTE — ED Triage Notes (Signed)
 Patient ran 5K over the weekend. Now has left foot pain and leg pain. Feels like something is pulling

## 2024-02-10 ENCOUNTER — Ambulatory Visit
Admission: EM | Admit: 2024-02-10 | Discharge: 2024-02-10 | Disposition: A | Discharge status: Home / Self Care | Attending: Emergency Medicine | Admitting: Emergency Medicine

## 2024-02-10 DIAGNOSIS — R21 Rash and other nonspecific skin eruption: Secondary | ICD-10-CM | POA: Diagnosis not present

## 2024-02-10 DIAGNOSIS — Z91018 Allergy to other foods: Secondary | ICD-10-CM | POA: Diagnosis not present

## 2024-02-10 MED ORDER — METHYLPREDNISOLONE 4 MG PO TBPK: ORAL_TABLET | ORAL | 0 refills | Status: DC

## 2024-02-10 MED ORDER — EPINEPHRINE 0.3 MG/0.3ML IJ SOAJ: 0.3000 mg | INTRAMUSCULAR | 0 refills | Status: AC | PRN

## 2024-02-10 NOTE — ED Provider Notes (Signed)
 [facility]    CSN: [id number] Arrival date & time: 02/10/24  1921      History   Chief Complaint Chief Complaint  Aaron Barton presents with   Rash    HPI Aaron Barton is a 28 y.o. male.   28 year old male Aaron Barton, Aaron Barton, presents to urgent care for evaluation of allergic reaction/rash x 2 days.  Aaron Barton states Aaron Barton recently ate some Hispanic food that Aaron Barton normally eats however Aaron Barton broke out into hives immediately after eating.  Aaron Barton denies any shortness of breath or difficulty breathing, Aaron Barton took Benadryl for symptoms.  The history is provided by the Aaron Barton. No language interpreter was used.    History reviewed. No pertinent past medical history.  Aaron Barton Active Problem List   Diagnosis Date Noted   Rash 02/10/2024   Food allergy 02/10/2024    Past Surgical History:  Procedure Laterality Date   HERNIA REPAIR         Home Medications    Prior to Admission medications   Medication Sig Start Date End Date Taking? Authorizing Provider  EPINEPHrine  0.3 mg/0.3 mL IJ SOAJ injection Inject 0.3 mg into the muscle as needed for anaphylaxis. 02/10/24  Yes Adysson Revelle, Rilla, NP  methylPREDNISolone  (MEDROL  DOSEPAK) 4 MG TBPK tablet Take as package directed 02/10/24  Yes Brendyn Mclaren, Rilla, NP  emtricitabine-tenofovir AF (DESCOVY) 200-25 MG tablet Take 1 tablet by mouth daily.    [provider]  hydrocortisone  (ANUSOL -HC) 25 MG suppository Place 1 suppository (25 mg total) rectally 2 (two) times daily. 09/24/16   Leann Satterfield, NP    Family History Family History  Problem Relation Age of Onset   Healthy Mother    Healthy Father     Social History Social History   Tobacco Use   Smoking status: Never   Smokeless tobacco: Never  Vaping Use   Vaping status: Never Used  Substance Use Topics   Alcohol use: No   Drug use: No     Allergies   Pineapple and Vancomycin   Review of Systems Review of Systems  Skin:  Positive for rash.   All other systems reviewed and are negative.    Physical Exam Triage Vital Signs ED Triage Vitals  Encounter Vitals Group     BP      Girls Systolic BP Percentile      Girls Diastolic BP Percentile      Boys Systolic BP Percentile      Boys Diastolic BP Percentile      Pulse      Resp      Temp      Temp src      SpO2      Weight      Height      Head Circumference      Peak Flow      Pain Score      Pain Loc      Pain Education      Exclude from Growth Chart    No data found.  Updated Vital Signs BP 108/76 (BP Location: Left Arm)   Pulse 100   Temp 98.6 F (37 C) (Oral)   Resp 17   Ht 5' 7 (1.702 m)   Wt 145 lb (65.8 kg)   SpO2 97%   BMI 22.71 kg/m   Visual Acuity Right Eye Distance:   Left Eye Distance:   Bilateral Distance:    Right Eye Near:   Left Eye Near:    Bilateral Near:  Physical Exam Vitals and nursing note reviewed.  HENT:     Head: Normocephalic.  Cardiovascular:     Rate and Rhythm: Normal rate and regular rhythm.     Heart sounds: Normal heart sounds.  Pulmonary:     Effort: Pulmonary effort is normal.     Breath sounds: Normal breath sounds and air entry.     Comments: No wheezing, no stridor, airway patent Skin:    General: Skin is warm.     Capillary Refill: Capillary refill takes less than 2 seconds.     Findings: Rash present. Rash is macular and papular.  Neurological:     General: No focal deficit present.     Mental Status: Aaron Barton is alert and oriented to person, place, and time.     GCS: GCS eye subscore is 4. GCS verbal subscore is 5. GCS motor subscore is 6.  Psychiatric:        Attention and Perception: Attention normal.        Mood and Affect: Mood normal.        Speech: Speech normal.        Behavior: Behavior normal. Behavior is cooperative.      UC Treatments / Results  Labs (all labs ordered are listed, but only abnormal results are displayed) Labs Reviewed - No data to  display  EKG   Radiology No results found.  Procedures Procedures (including critical care time)  Medications Ordered in UC Medications - No data to display  Initial Impression / Assessment and Plan / UC Course  I have reviewed the triage vital signs and the nursing notes.  Pertinent labs & imaging results that were available during my care of the Aaron Barton were reviewed by me and considered in my medical decision making (see chart for details).    Discussed exam findings and plan of care with Aaron Barton, EpiPen  prescription to , Medrol  dose scripted , avoid known allergens , strict go to ER precautions given.   Aaron Barton verbalized understanding to this provider.  Ddx: Food allergy, rash, contact dermatitis Final Clinical Impressions(s) / UC Diagnoses   Final diagnoses:  Rash  Food allergy     Discharge Instructions      Avoid heat ,hot water  as it makes rashes worse. Take meds as directed(medrol ). Epi pen for emergency use, then call 9-1-1-. Please follow up with PCP, recommend food allergy testing, avoid possible foods that may cause allergy. GO to Er for new or worsening issues.     ED Prescriptions     Medication Sig Dispense Auth. Provider   EPINEPHrine  0.3 mg/0.3 mL IJ SOAJ injection Inject 0.3 mg into the muscle as needed for anaphylaxis. 1 each Kaivon Livesey, Rilla, NP   methylPREDNISolone  (MEDROL  DOSEPAK) 4 MG TBPK tablet Take as package directed 21 tablet Voula Waln, Rilla, NP      PDMP not reviewed this encounter.   Aminta Rilla, NP 02/10/24 2121

## 2024-02-10 NOTE — ED Triage Notes (Signed)
 Pt states that he has a rash all over. X2 days

## 2024-02-10 NOTE — Discharge Instructions (Addendum)
 Avoid heat ,hot water  as it makes rashes worse. Take meds as directed(medrol ). Epi pen for emergency use, then call 9-1-1-. Please follow up with PCP, recommend food allergy testing, avoid possible foods that may cause allergy. GO to Er for new or worsening issues.

## 2024-05-23 ENCOUNTER — Encounter: Payer: Self-pay | Admitting: Emergency Medicine

## 2024-05-23 ENCOUNTER — Ambulatory Visit
Admission: EM | Admit: 2024-05-23 | Discharge: 2024-05-23 | Disposition: A | Discharge status: Home / Self Care | Attending: Emergency Medicine | Admitting: Emergency Medicine

## 2024-05-23 DIAGNOSIS — B9789 Other viral agents as the cause of diseases classified elsewhere: Secondary | ICD-10-CM | POA: Insufficient documentation

## 2024-05-23 DIAGNOSIS — J029 Acute pharyngitis, unspecified: Secondary | ICD-10-CM | POA: Diagnosis present

## 2024-05-23 DIAGNOSIS — J069 Acute upper respiratory infection, unspecified: Secondary | ICD-10-CM | POA: Insufficient documentation

## 2024-05-23 DIAGNOSIS — J329 Chronic sinusitis, unspecified: Secondary | ICD-10-CM | POA: Insufficient documentation

## 2024-05-23 LAB — POC COVID19/FLU A&B COMBO
Covid Antigen, POC: NEGATIVE
Influenza A Antigen, POC: NEGATIVE
Influenza B Antigen, POC: NEGATIVE

## 2024-05-23 LAB — POCT RAPID STREP A (OFFICE): Rapid Strep A Screen: NEGATIVE

## 2024-05-23 MED ORDER — PROMETHAZINE-DM 6.25-15 MG/5ML PO SYRP: 5.0000 mL | ORAL_SOLUTION | Freq: Four times a day (QID) | ORAL | 0 refills | Status: AC | PRN

## 2024-05-23 MED ORDER — FAMOTIDINE 20 MG PO TABS
20.0000 mg | ORAL_TABLET | Freq: Two times a day (BID) | ORAL | 0 refills | Status: AC
Start: 2024-05-23 — End: ?

## 2024-05-23 MED ORDER — FLUTICASONE PROPIONATE 50 MCG/ACT NA SUSP: 2.0000 | Freq: Every day | NASAL | 0 refills | Status: AC

## 2024-05-23 NOTE — ED Triage Notes (Signed)
 Pt c/o strep, cough, fever and bodyaches x 2 days. Pt has not taken any medication for his symptoms.

## 2024-05-23 NOTE — ED Provider Notes (Signed)
 HPI  SUBJECTIVE:  Aaron Barton is a 28 y.o. male who presents with \\4  days of sore throat, body aches, fevers Tmax 100, sinus headache, nasal congestion, postnasal drip.  He reports right sided neck tenderness cough productive of phlegm.  He is unable to sleep at night because of the cough.  No facial swelling, upper dental pain, ear pain, rhinorrhea, wheezing, shortness of breath, nausea, vomiting, diarrhea, dental pain, rash, drooling, trismus, voice changes, neck stiffness, sensation of throat swelling shut.  No known COVID, flu, strep or mono exposure.  He had 2 doses of COVID-vaccine.  Did not get this years flu vaccine.  No allergy symptoms.  He does report belching but denies burning chest pain or waterbrash.  No antibiotics in the past month.  No antipyretic in the past 6 hours.  He tried rest and fluids.  Symptoms are better during the day and worse at night.  Patient has a history of GERD, is status post tonsillectomy.  No history of HIV, but is on PrEP.  No history of diabetes, hypertension, chronic kidney disease.  PCP: [facility].  History reviewed. No pertinent past medical history.  Past Surgical History:  Procedure Laterality Date   HERNIA REPAIR      Family History  Problem Relation Age of Onset   Healthy Mother    Healthy Father     Social History   Tobacco Use   Smoking status: Never   Smokeless tobacco: Never  Vaping Use   Vaping status: Never Used  Substance Use Topics   Alcohol use: No   Drug use: No    No current facility-administered medications for this encounter.  Current Outpatient Medications:    famotidine (PEPCID) 20 MG tablet, Take 1 tablet (20 mg total) by mouth 2 (two) times daily., Disp: 40 tablet, Rfl: 0   fluticasone (FLONASE) 50 MCG/ACT nasal spray, Place 2 sprays into both nostrils daily., Disp: 16 g, Rfl: 0   promethazine-dextromethorphan (PROMETHAZINE-DM) 6.25-15 MG/5ML syrup, Take 5 mLs by mouth 4 (four) times daily as needed for  cough., Disp: 118 mL, Rfl: 0   emtricitabine-tenofovir AF (DESCOVY) 200-25 MG tablet, Take 1 tablet by mouth daily., Disp: , Rfl:    EPINEPHrine  0.3 mg/0.3 mL IJ SOAJ injection, Inject 0.3 mg into the muscle as needed for anaphylaxis., Disp: 1 each, Rfl: 0   hydrocortisone  (ANUSOL -HC) 25 MG suppository, Place 1 suppository (25 mg total) rectally 2 (two) times daily., Disp: 12 suppository, Rfl: 0  Allergies  Allergen Reactions   Pineapple Itching   Vancomycin Hives     ROS  As noted in HPI.   Physical Exam  BP 116/83 (BP Location: Left Arm)   Pulse 98   Temp 99.3 F (37.4 C) (Oral)   Resp 18   Wt 67.6 kg   SpO2 99%   BMI 23.34 kg/m   Constitutional: Well developed, well nourished, no acute distress Eyes: PERRL, EOMI, conjunctiva normal bilaterally HENT: Normocephalic, atraumatic,mucus membranes moist.  Positive nasal congestion.  Normal turbinates.  Positive maxillary sinus tenderness.  Tonsils surgically absent.  Erythematous oropharynx.  No obvious postnasal drip. Neck: Bilateral tender cervical lymphadenopathy worse on the right. Respiratory: Clear to auscultation bilaterally, no rales, no wheezing, no rhonchi Cardiovascular: Normal rate and rhythm, no murmurs, no gallops, no rubs GI: nondistended, no splenomegaly, nontender skin: No rash, skin intact Musculoskeletal: no deformities Neurologic: Alert & oriented x 3, CN III-XII grossly intact, no motor deficits, sensation grossly intact Psychiatric: Speech and behavior appropriate  ED Course   Medications - No data to display  Orders Placed This Encounter  Procedures   Culture, group A strep    Standing Status:   Standing    Number of Occurrences:   1   POC Covid19/Flu A&B Antigen    Standing Status:   Standing    Number of Occurrences:   1   POC rapid strep A    Standing Status:   Standing    Number of Occurrences:   1   Results for orders placed or performed during the hospital encounter of 05/23/24 (from  the past 24 hours)  POC Covid19/Flu A&B Antigen     Status: Normal   Collection Time: 05/23/24 10:14 AM  Result Value Ref Range   Influenza A Antigen, POC Negative Negative   Influenza B Antigen, POC Negative Negative   Covid Antigen, POC Negative Negative  POC rapid strep A     Status: Normal   Collection Time: 05/23/24 10:14 AM  Result Value Ref Range   Rapid Strep A Screen Negative Negative   No results found.  ED Clinical Impression  1. Viral URI with cough   2. Sore throat   3. Viral sinusitis      ED Assessment/Plan     Previous records reviewed.  Additional medical history obtained.  As noted in HPI.  COVID, flu, strep negative.  Discussed with patient while in department.  Will send throat culture off to confirm absence of strep throat given that he has cervical lymphadenopathy.  Presentation consistent with a viral pharyngitis/sinusitis.  May have a component of acid reflux to this as he is reporting increased belching and has a history of GERD.  No evidence of bacterial sinusitis at this time.  He does not meet criteria for antibiotics today.  Home with Benadryl/Maalox, Tylenol  1000 mg 3-4 times a day.  No ibuprofen  because of the Descovy. saline nasal irrigation, Flonase, Mucinex D and Promethazine DM for the cough.  Pepcid in case this is acid reflux.  Advised follow-up with his PCP at [facility].  ER return precautions given.  Work note for several days.  Discussed labs,  MDM, treatment plan, and plan for follow-up with patient Discussed sn/sx that should prompt return to the ED. patient agrees with plan.   Meds ordered this encounter  Medications   famotidine (PEPCID) 20 MG tablet    Sig: Take 1 tablet (20 mg total) by mouth 2 (two) times daily.    Dispense:  40 tablet    Refill:  0   fluticasone (FLONASE) 50 MCG/ACT nasal spray    Sig: Place 2 sprays into both nostrils daily.    Dispense:  16 g    Refill:  0   promethazine-dextromethorphan (PROMETHAZINE-DM)  6.25-15 MG/5ML syrup    Sig: Take 5 mLs by mouth 4 (four) times daily as needed for cough.    Dispense:  118 mL    Refill:  0      *This clinic note was created using Scientist, clinical (histocompatibility and immunogenetics). Therefore, there may be occasional mistakes despite careful proofreading. ?    Van Knee, MD 05/23/24 1944

## 2024-05-23 NOTE — Discharge Instructions (Signed)
 COVID, flu negative.  Your rapid strep was negative today.  We have sent it off for culture to confirm absence of strep throat.  If it is positive, we will contact you and call in the appropriate antibiotics.  1 gram of Tylenol   3-4 times a day as needed for pain.  No ibuprofen  because of the Descovy.  make sure you drink plenty of extra fluids.  Some people find salt water  gargles and  Traditional Medicinal's Throat Coat tea helpful. Take 5 mL of liquid Benadryl and 5 mL of Maalox/Mylanta. Mix it together, and then hold it in your mouth for as long as you can and then swallow. You may do this 4 times a day.  Honey and lemon dissolved in hot water  can also be soothing.  Start Mucinex-D to keep the mucous thin and to decongest you.    Most sinus infections are viral and do not need antibiotics unless you have a high fever, have had this for 10 days, or you get better and then get sick again. Use a NeilMed sinus rinse with distilled water  as often as you want to to reduce nasal congestion. Follow the directions on the box.  Flonase.  Promethazine DM for cough.  Pepcid in case there is a component of acid reflux to this.  Go to www.goodrx.com to look up your medications. This will give you a list of where you can find your prescriptions at the most affordable prices. Or you can ask the pharmacist what the cash price is. This is frequently cheaper than going through insurance.    Go to www.goodrx.com  or www.costplusdrugs.com to look up your medications. This will give you a list of where you can find your prescriptions at the most affordable prices. Or ask the pharmacist what the cash price is, or if they have any other discount programs available to help make your medication more affordable. This can be less expensive than what you would pay with insurance.

## 2024-05-26 ENCOUNTER — Ambulatory Visit (HOSPITAL_COMMUNITY): Payer: Self-pay

## 2024-05-26 LAB — CULTURE, GROUP A STREP (THRC)

## 2024-06-14 ENCOUNTER — Ambulatory Visit
Admission: EM | Admit: 2024-06-14 | Discharge: 2024-06-14 | Disposition: A | Discharge status: Home / Self Care | Attending: Family Medicine | Admitting: Family Medicine

## 2024-06-14 ENCOUNTER — Ambulatory Visit (INDEPENDENT_AMBULATORY_CARE_PROVIDER_SITE_OTHER)

## 2024-06-14 ENCOUNTER — Encounter: Payer: Self-pay | Admitting: Emergency Medicine

## 2024-06-14 DIAGNOSIS — J989 Respiratory disorder, unspecified: Secondary | ICD-10-CM | POA: Diagnosis not present

## 2024-06-14 DIAGNOSIS — J069 Acute upper respiratory infection, unspecified: Secondary | ICD-10-CM

## 2024-06-14 DIAGNOSIS — R509 Fever, unspecified: Secondary | ICD-10-CM

## 2024-06-14 LAB — POCT MONO SCREEN (KUC): Mono, POC: NEGATIVE

## 2024-06-14 LAB — POCT RAPID STREP A (OFFICE): Rapid Strep A Screen: NEGATIVE

## 2024-06-14 LAB — POC COVID19/FLU A&B COMBO
Covid Antigen, POC: NEGATIVE
Influenza A Antigen, POC: NEGATIVE
Influenza B Antigen, POC: NEGATIVE

## 2024-06-14 MED ORDER — LEVOFLOXACIN 500 MG PO TABS: 500.0000 mg | ORAL_TABLET | Freq: Every day | ORAL | 0 refills | Status: AC

## 2024-06-14 MED ORDER — ACETAMINOPHEN 325 MG PO TABS
975.0000 mg | ORAL_TABLET | Freq: Once | ORAL | Status: AC
  Administered 2024-06-14: 975 mg via ORAL

## 2024-06-14 MED ORDER — HYDROCOD POLI-CHLORPHE POLI ER 10-8 MG/5ML PO SUER: 5.0000 mL | Freq: Two times a day (BID) | ORAL | 0 refills | Status: AC | PRN

## 2024-06-14 NOTE — ED Triage Notes (Signed)
 Pt presents with a sore throat, fever, bodyaches and left ear pain x 2 days. Pt was sick 2 weeks ago with the same symptoms. He was exposed to Mono. He has taken NyQuil for his symptoms.

## 2024-06-14 NOTE — ED Provider Notes (Signed)
 [facility]    CSN: [id number] Arrival date & time: 06/14/24  1859      History   Chief Complaint Chief Complaint  Patient presents with   Otalgia   Generalized Body Aches   Fever   Sore Throat    HPI Aaron Barton is a 28 y.o. male.   HPI  History obtained from the patient and his finance  Aaron Barton presents for headache, body aches, fever, cough,  Tmax 101 F at home but 102.4 F.  Took NyQuil.  Two weeks ago he was sick but never really got over it. His family member and fiance have been ill but they are feeling better.     History reviewed. No pertinent past medical history.  Patient Active Problem List   Diagnosis Date Noted   Rash 02/10/2024   Food allergy 02/10/2024    Past Surgical History:  Procedure Laterality Date   HERNIA REPAIR         Home Medications    Prior to Admission medications   Medication Sig Start Date End Date Taking? Authorizing Provider  chlorpheniramine-HYDROcodone (TUSSIONEX) 10-8 MG/5ML Take 5 mLs by mouth every 12 (twelve) hours as needed. 06/14/24  Yes Barton Want, DO  levofloxacin  (LEVAQUIN ) 500 MG tablet Take 1 tablet (500 mg total) by mouth daily. 06/14/24  Yes Jibri Schriefer, DO  emtricitabine-tenofovir AF (DESCOVY) 200-25 MG tablet Take 1 tablet by mouth daily.    [provider]  EPINEPHrine  0.3 mg/0.3 mL IJ SOAJ injection Inject 0.3 mg into the muscle as needed for anaphylaxis. 02/10/24   Defelice, Jeanette, NP  famotidine  (PEPCID ) 20 MG tablet Take 1 tablet (20 mg total) by mouth 2 (two) times daily. 05/23/24   Van Knee, MD  fluticasone  (FLONASE ) 50 MCG/ACT nasal spray Place 2 sprays into both nostrils daily. 05/23/24   Van Knee, MD  hydrocortisone  (ANUSOL -HC) 25 MG suppository Place 1 suppository (25 mg total) rectally 2 (two) times daily. 09/24/16   Leann Satterfield, NP  promethazine -dextromethorphan (PROMETHAZINE -DM) 6.25-15 MG/5ML syrup Take 5 mLs by mouth 4 (four) times daily as  needed for cough. 05/23/24   Van Knee, MD    Family History Family History  Problem Relation Age of Onset   Healthy Mother    Healthy Father     Social History Social History   Tobacco Use   Smoking status: Never   Smokeless tobacco: Never  Vaping Use   Vaping status: Never Used  Substance Use Topics   Alcohol use: No   Drug use: No     Allergies   Pineapple and Vancomycin   Review of Systems Review of Systems: negative unless otherwise stated in HPI.      Physical Exam Triage Vital Signs ED Triage Vitals  Encounter Vitals Group     BP --      Girls Systolic BP Percentile --      Girls Diastolic BP Percentile --      Boys Systolic BP Percentile --      Boys Diastolic BP Percentile --      Pulse Rate 06/14/24 1910 (!) 117     Resp --      Temp 06/14/24 1910 (!) 102.4 F (39.1 C)     Temp Source 06/14/24 1910 Oral     SpO2 --      Weight 06/14/24 1904 153 lb (69.4 kg)     Height --      Head Circumference --      Peak Flow --  Pain Score 06/14/24 1908 7     Pain Loc --      Pain Education --      Exclude from Growth Chart --    No data found.  Updated Vital Signs Pulse (!) 117   Temp (!) 102.4 F (39.1 C) (Oral)   Wt 69.4 kg   BMI 23.96 kg/m   Visual Acuity Right Eye Distance:   Left Eye Distance:   Bilateral Distance:    Right Eye Near:   Left Eye Near:    Bilateral Near:     Physical Exam GEN:     alert, non-toxic appearing male in no distress    HENT:  mucus membranes moist, oropharyngeal without lesions, mild erythema, no tonsillar hypertrophy or exudates, clear nasal discharge, bilateral TM normal EYES:   pupils equal and reactive, no scleral injection or discharge NECK:  normal ROM, +lymphadenopathy, no meningismus   RESP:  no increased work of breathing, coarse breathe sounds bilaterally CVS:   regular rhythm, tachycardic Skin:   warm and dry, no rash on visible skin    UC Treatments / Results  Labs (all labs  ordered are listed, but only abnormal results are displayed) Labs Reviewed  POCT MONO SCREEN ([facility]) - Normal  POCT RAPID STREP A (OFFICE) - Normal  POC COVID19/FLU A&B COMBO - Normal    EKG   Radiology DG Chest 2 View Result Date: 06/14/2024 CLINICAL DATA:  Cough. EXAM: DG CHEST 2V COMPARISON:  None Available. FINDINGS: The heart size and mediastinal contours are within normal limits. Both lungs are clear. The visualized skeletal structures are unremarkable. IMPRESSION: No active cardiopulmonary disease. Electronically Signed   By: Vanetta Chou M.D.   On: 06/14/2024 19:43      Procedures Procedures (including critical care time)  Medications Ordered in UC Medications  acetaminophen  (TYLENOL ) tablet 975 mg (975 mg Oral Given 06/14/24 1918)    Initial Impression / Assessment and Plan / UC Course  I have reviewed the triage vital signs and the nursing notes.  Pertinent labs & imaging results that were available during my care of the patient were reviewed by me and considered in my medical decision making (see chart for details).       Pt is a 28 y.o. male who presents for fever in the setting of 2 weeks of respiratory symptoms. [name] is tachycardic and febrile here. Satting well on room air. Overall pt is non-toxic appearing, well hydrated, without respiratory distress. Pulmonary exam is unremarkable.  POC COVID and influenza panel obtained and was negative. POC mono is negative. POC strep is negative.  Strep throat culture sent.   Given fever and cough recommended chest xray to assess for pneumonia.  Radiologist impression reviewed and notes no acute processes.   Given duration of symptoms and new fever will cover with antibiotics as below. Tussionex for cough. Typical duration of symptoms discussed.   Return and ED precautions given and voiced understanding. Discussed MDM, treatment plan and plan for follow-up with patient  who agrees with plan.     Final Clinical  Impressions(s) / UC Diagnoses   Final diagnoses:  Respiratory illness with fever  URI with cough and congestion     Discharge Instructions      Your strep, COVID and influenza tests are all negative.  Your chest xray did not show evidence of pneumonia.  You are being treated for a respiratory infection with antibiotics. Stop by the pharmacy to pick up your prescriptions.  Follow  up with your primary care provider or return to the urgent care, if not improving.    The cough medication prescribed can cause drowsiness. Do not drive or operate heavy machinery while taking this medication.        ED Prescriptions     Medication Sig Dispense Auth. Provider   levofloxacin  (LEVAQUIN ) 500 MG tablet Take 1 tablet (500 mg total) by mouth daily. 7 tablet Lillard Bailon, DO   chlorpheniramine-HYDROcodone (TUSSIONEX) 10-8 MG/5ML Take 5 mLs by mouth every 12 (twelve) hours as needed. 115 mL Dvora Buitron, DO      I have reviewed the PDMP during this encounter.   Brooklyne Radke, DO 06/24/24 1441

## 2024-06-14 NOTE — Discharge Instructions (Addendum)
 Your strep, COVID and influenza tests are all negative.  Your chest xray did not show evidence of pneumonia.  You are being treated for a respiratory infection with antibiotics. Stop by the pharmacy to pick up your prescriptions.  Follow up with your primary care provider or return to the urgent care, if not improving.    The cough medication prescribed can cause drowsiness. Do not drive or operate heavy machinery while taking this medication.
